# Patient Record
Sex: Female | Born: 1995 | Race: Black or African American | Hispanic: No | Marital: Single | Smoking: Former smoker
Health system: Southern US, Community
[De-identification: ages and names within clinical notes are randomized; demographics above are authoritative.]

## PROBLEM LIST (undated history)

## (undated) ENCOUNTER — Inpatient Hospital Stay (HOSPITAL_COMMUNITY): Payer: Self-pay

## (undated) ENCOUNTER — Ambulatory Visit (HOSPITAL_COMMUNITY): Admission: EM

## (undated) DIAGNOSIS — F32A Depression, unspecified: Secondary | ICD-10-CM

## (undated) DIAGNOSIS — F329 Major depressive disorder, single episode, unspecified: Secondary | ICD-10-CM

## (undated) DIAGNOSIS — O009 Unspecified ectopic pregnancy without intrauterine pregnancy: Secondary | ICD-10-CM

## (undated) HISTORY — PX: WISDOM TOOTH EXTRACTION: SHX21

## (undated) HISTORY — PX: TONSILLECTOMY: SUR1361

---

## 2018-03-30 ENCOUNTER — Ambulatory Visit (HOSPITAL_COMMUNITY)
Admission: EM | Admit: 2018-03-30 | Discharge: 2018-03-30 | Disposition: A | Discharge status: Home / Self Care | Payer: Self-pay | Attending: Family Medicine | Admitting: Family Medicine

## 2018-03-30 ENCOUNTER — Encounter (HOSPITAL_COMMUNITY): Payer: Self-pay | Admitting: Emergency Medicine

## 2018-03-30 DIAGNOSIS — N898 Other specified noninflammatory disorders of vagina: Secondary | ICD-10-CM | POA: Insufficient documentation

## 2018-03-30 DIAGNOSIS — F329 Major depressive disorder, single episode, unspecified: Secondary | ICD-10-CM | POA: Insufficient documentation

## 2018-03-30 DIAGNOSIS — Z880 Allergy status to penicillin: Secondary | ICD-10-CM | POA: Insufficient documentation

## 2018-03-30 DIAGNOSIS — N3090 Cystitis, unspecified without hematuria: Secondary | ICD-10-CM

## 2018-03-30 DIAGNOSIS — N390 Urinary tract infection, site not specified: Secondary | ICD-10-CM | POA: Insufficient documentation

## 2018-03-30 DIAGNOSIS — R809 Proteinuria, unspecified: Secondary | ICD-10-CM | POA: Insufficient documentation

## 2018-03-30 DIAGNOSIS — Z79899 Other long term (current) drug therapy: Secondary | ICD-10-CM | POA: Insufficient documentation

## 2018-03-30 HISTORY — DX: Major depressive disorder, single episode, unspecified: F32.9

## 2018-03-30 HISTORY — DX: Depression, unspecified: F32.A

## 2018-03-30 LAB — POCT URINALYSIS DIP (DEVICE)
BILIRUBIN URINE: NEGATIVE
Glucose, UA: NEGATIVE mg/dL
Ketones, ur: NEGATIVE mg/dL
Nitrite: NEGATIVE
PH: 8.5 — AB (ref 5.0–8.0)
Protein, ur: >=300 mg/dL — AB
Urobilinogen, UA: 0.2 mg/dL (ref 0.0–1.0)

## 2018-03-30 LAB — POCT PREGNANCY, URINE: Preg Test, Ur: NEGATIVE

## 2018-03-30 MED ORDER — SULFAMETHOXAZOLE-TRIMETHOPRIM 800-160 MG PO TABS: 1.0000 | ORAL_TABLET | Freq: Two times a day (BID) | ORAL | 0 refills | Status: AC

## 2018-03-30 NOTE — ED Provider Notes (Signed)
[facility]    CSN: [id number] Arrival date & time: 03/30/18  1458     History   Chief Complaint Chief Complaint  Patient presents with  . Dysuria    HPI Caitlin Grant is a 22 y.o. female.   22 year old female comes in for 2 week history of UTI symptoms.  She has had urinary frequency, urgency, hesitancy, dysuria.  States she has been trying to "get rid of the UTI" on her own, drinking cranberry juice and staying hydrated without relief.  She has intermittent low abdominal pressure, no obvious aggravating or alleviating factor.  Denies fever, chills, night sweats.  Mild nausea without vomiting. Mild vaginal discharge without vaginal irritation/pain.  LMP 03/16/2018.  Sexually active with one female partner, no condom use.  No other birth control use.     Past Medical History:  Diagnosis Date  . Depression     There are no active problems to display for this patient.   History reviewed. No pertinent surgical history.  OB History   None      Home Medications    Prior to Admission medications   Medication Sig Start Date End Date Taking? Authorizing Provider  atomoxetine (STRATTERA) 100 MG capsule Take 100 mg by mouth daily.   Yes [provider]  escitalopram (LEXAPRO) 20 MG tablet Take 20 mg by mouth daily.   Yes [provider]  mirtazapine (REMERON) 45 MG tablet Take 45 mg by mouth at bedtime.   Yes [provider]  sulfamethoxazole-trimethoprim (BACTRIM DS,SEPTRA DS) 800-160 MG tablet Take 1 tablet by mouth 2 (two) times daily for 3 days. 03/30/18 04/02/18  Belinda Fisher, PA-C    Family History No family history on file.  Social History Social History   Tobacco Use  . Smoking status: Never Smoker  Substance Use Topics  . Alcohol use: Yes  . Drug use: Never     Allergies   Penicillins   Review of Systems Review of Systems  Reason unable to perform ROS: See HPI as above.     Physical Exam Triage Vital Signs ED  Triage Vitals [03/30/18 1522]  Enc Vitals Group     BP 127/63     Pulse Rate (!) 102     Resp 18     Temp 97.7 F (36.5 C)     Temp Source Temporal     SpO2 100 %     Weight      Height      Head Circumference      Peak Flow      Pain Score      Pain Loc      Pain Edu?      Excl. in GC?    No data found.  Updated Vital Signs BP 127/63   Pulse (!) 102   Temp 97.7 F (36.5 C) (Temporal)   Resp 18   LMP 03/16/2018   SpO2 100%   Physical Exam  Constitutional: She is oriented to person, place, and time. She appears well-developed and well-nourished. No distress.  HENT:  Head: Normocephalic and atraumatic.  Eyes: Pupils are equal, round, and reactive to light. Conjunctivae are normal.  Cardiovascular: Normal rate, regular rhythm and normal heart sounds. Exam reveals no gallop and no friction rub.  No murmur heard. Pulmonary/Chest: Effort normal and breath sounds normal. She has no wheezes. She has no rales.  Abdominal: Soft. Bowel sounds are normal. She exhibits no mass. There is no rebound, no  guarding and no CVA tenderness.  Mild tenderness to low abdomen without guarding or rebound.   Neurological: She is alert and oriented to person, place, and time.  Skin: Skin is warm and dry.  Psychiatric: She has a normal mood and affect. Her behavior is normal. Judgment normal.    UC Treatments / Results  Labs (all labs ordered are listed, but only abnormal results are displayed) Labs Reviewed  POCT URINALYSIS DIP (DEVICE) - Abnormal; Notable for the following components:      Result Value   Hgb urine dipstick LARGE (*)    pH 8.5 (*)    Protein, ur >=300 (*)    Leukocytes, UA SMALL (*)    All other components within normal limits  URINE CULTURE  POCT PREGNANCY, URINE  URINE CYTOLOGY ANCILLARY ONLY    EKG None Radiology No results found.  Procedures Procedures (including critical care time)  Medications Ordered in UC Medications - No data to display   Initial  Impression / Assessment and Plan / UC Course  I have reviewed the triage vital signs and the nursing notes.  Pertinent labs & imaging results that were available during my care of the patient were reviewed by me and considered in my medical decision making (see chart for details).    Will treat for UTI with bactrim. Urine culture sent. Given blood and protein in urine, will have patient follow up in 2-3 weeks for recheck. Cytology sent, patient will be contacted with any positive results that require additional treatment. Patient to refrain from sexual activity for the next 7 days. Return precautions given. Otherwise follow up here or with PCP in 2-3 weeks for recheck of urine.   Case discussed with Dr Milus GlazierLauenstein, who agrees to plan.   Final Clinical Impressions(s) / UC Diagnoses   Final diagnoses:  Cystitis    ED Discharge Orders        Ordered    sulfamethoxazole-trimethoprim (BACTRIM DS,SEPTRA DS) 800-160 MG tablet  2 times daily     03/30/18 1548        Belinda FisherYu, Amy V, [place] 03/30/18 1557

## 2018-03-30 NOTE — ED Triage Notes (Signed)
Pt c/o lower pelvic pain and dysuria x2 weeks

## 2018-03-30 NOTE — Discharge Instructions (Addendum)
Urine with small amount of bacteria, given your symptoms, will treat you for urinary tract infection. Start bactrim as directed. Your urine also showed some blood and protein, this could be due to the infection, but will need to be monitored to make sure it resolves. Please follow up with PCP or here in 2-3 weeks for urine recheck. I have also sent the urine for culture to make sure infection is not resistant to the antibiotic we gave you. Cytology sent, you will be contacted with any positive results that requires further treatment. Refrain from sexual activity and alcohol use for the next 7 days. Monitor for any worsening of symptoms, fever, abdominal pain, nausea, vomiting, to follow up for reevaluation.

## 2018-04-01 ENCOUNTER — Telehealth (HOSPITAL_COMMUNITY): Payer: Self-pay

## 2018-04-01 LAB — URINE CULTURE: Culture: 10000 — AB

## 2018-04-01 LAB — URINE CYTOLOGY ANCILLARY ONLY: CANDIDA VAGINITIS: NEGATIVE

## 2018-04-01 NOTE — Telephone Encounter (Signed)
Urine culture was positive for E. Coli and was given Bactrim  at urgent care visit. No answer at this time. Voicemail left.

## 2018-04-02 LAB — URINE CYTOLOGY ANCILLARY ONLY
Chlamydia: NEGATIVE
Neisseria Gonorrhea: NEGATIVE
TRICH (WINDOWPATH): POSITIVE — AB

## 2018-04-05 ENCOUNTER — Telehealth (HOSPITAL_COMMUNITY): Payer: Self-pay

## 2018-04-05 MED ORDER — METRONIDAZOLE 500 MG PO TABS: 500.0000 mg | ORAL_TABLET | Freq: Two times a day (BID) | ORAL | 0 refills | Status: DC

## 2018-04-05 NOTE — Telephone Encounter (Signed)
Test results positive for Trichomonas and Bacterial Vaginosis. Pt requires prescription for Flagyl 500 mg bid x 7 days. No answer and voicemail not set up, will try again.

## 2018-04-07 ENCOUNTER — Telehealth (HOSPITAL_COMMUNITY): Payer: Self-pay

## 2018-04-07 NOTE — Telephone Encounter (Signed)
Pt returned call and aware of results  

## 2018-04-07 NOTE — Telephone Encounter (Signed)
Attempted to reach pt.x2

## 2018-04-13 ENCOUNTER — Ambulatory Visit: Payer: Self-pay | Admitting: Internal Medicine

## 2018-05-04 ENCOUNTER — Encounter (HOSPITAL_COMMUNITY): Payer: Self-pay | Admitting: Family Medicine

## 2018-05-04 ENCOUNTER — Ambulatory Visit (HOSPITAL_COMMUNITY)
Admission: EM | Admit: 2018-05-04 | Discharge: 2018-05-04 | Disposition: A | Discharge status: Home / Self Care | Payer: Self-pay | Attending: Family Medicine | Admitting: Family Medicine

## 2018-05-04 DIAGNOSIS — R300 Dysuria: Secondary | ICD-10-CM

## 2018-05-04 DIAGNOSIS — Z79899 Other long term (current) drug therapy: Secondary | ICD-10-CM | POA: Insufficient documentation

## 2018-05-04 DIAGNOSIS — N898 Other specified noninflammatory disorders of vagina: Secondary | ICD-10-CM

## 2018-05-04 DIAGNOSIS — Z202 Contact with and (suspected) exposure to infections with a predominantly sexual mode of transmission: Secondary | ICD-10-CM

## 2018-05-04 DIAGNOSIS — Z113 Encounter for screening for infections with a predominantly sexual mode of transmission: Secondary | ICD-10-CM

## 2018-05-04 DIAGNOSIS — Z3202 Encounter for pregnancy test, result negative: Secondary | ICD-10-CM

## 2018-05-04 DIAGNOSIS — F329 Major depressive disorder, single episode, unspecified: Secondary | ICD-10-CM | POA: Insufficient documentation

## 2018-05-04 DIAGNOSIS — Z880 Allergy status to penicillin: Secondary | ICD-10-CM | POA: Insufficient documentation

## 2018-05-04 LAB — POCT URINALYSIS DIP (DEVICE)
BILIRUBIN URINE: NEGATIVE
Glucose, UA: NEGATIVE mg/dL
Hgb urine dipstick: NEGATIVE
KETONES UR: NEGATIVE mg/dL
NITRITE: NEGATIVE
Protein, ur: NEGATIVE mg/dL
Specific Gravity, Urine: 1.015 (ref 1.005–1.030)
Urobilinogen, UA: 0.2 mg/dL (ref 0.0–1.0)
pH: 6.5 (ref 5.0–8.0)

## 2018-05-04 LAB — POCT PREGNANCY, URINE: PREG TEST UR: NEGATIVE

## 2018-05-04 MED ORDER — ESCITALOPRAM OXALATE 20 MG PO TABS: 20.0000 mg | ORAL_TABLET | Freq: Every day | ORAL | 0 refills | Status: DC

## 2018-05-04 MED ORDER — AZITHROMYCIN 250 MG PO TABS
1000.0000 mg | ORAL_TABLET | Freq: Once | ORAL | Status: AC
  Administered 2018-05-04: 1000 mg via ORAL

## 2018-05-04 MED ORDER — METRONIDAZOLE 500 MG PO TABS: 2000.0000 mg | ORAL_TABLET | Freq: Once | ORAL | 0 refills | Status: AC

## 2018-05-04 MED ORDER — AZITHROMYCIN 250 MG PO TABS
ORAL_TABLET | ORAL | Status: AC
  Filled 2018-05-04: qty 4

## 2018-05-04 MED ORDER — ATOMOXETINE HCL 100 MG PO CAPS: 100.0000 mg | ORAL_CAPSULE | Freq: Every day | ORAL | 0 refills | Status: DC

## 2018-05-04 MED ORDER — LIDOCAINE HCL (PF) 1 % IJ SOLN
INTRAMUSCULAR | Status: AC
  Filled 2018-05-04: qty 2

## 2018-05-04 MED ORDER — CEFTRIAXONE SODIUM 250 MG IJ SOLR
250.0000 mg | Freq: Once | INTRAMUSCULAR | Status: AC
Start: 2018-05-04 — End: 2018-05-04
  Administered 2018-05-04: 250 mg via INTRAMUSCULAR

## 2018-05-04 MED ORDER — MIRTAZAPINE 45 MG PO TABS: 45.0000 mg | ORAL_TABLET | Freq: Every day | ORAL | 0 refills | Status: DC

## 2018-05-04 MED ORDER — CEFTRIAXONE SODIUM 250 MG IJ SOLR
INTRAMUSCULAR | Status: AC
  Filled 2018-05-04: qty 250

## 2018-05-04 NOTE — Discharge Instructions (Signed)
We have treated you today for gonorrhea and chlamydia, with Rocephin and azithromycin.  I am also going to re-treat you for trichomonas, please take 4 tablets of metronidazole tomorrow.  Please refrain from sexual intercourse for 7 days while medicines eliminating infection.   We are testing you for Gonorrhea, Chlamydia, Trichomonas, Yeast and Bacterial Vaginosis. We will call you if anything is positive and let you know if you require any further treatment. Please inform partners of any positive results.   Please return if symptoms not improving with treatment, development of fever, nausea, vomiting, abdominal pain.

## 2018-05-04 NOTE — ED Provider Notes (Signed)
[facility]    CSN: [id number] Arrival date & time: 05/04/18  1724     History   Chief Complaint No chief complaint on file.   HPI Caitlin Grant is a 22 y.o. female presenting today for evaluation of STDs/vaginal discharge as well as medication refill.  Patient states that she recently found out her partner was exposed to gonorrhea.  States she has been having vaginal discharge for the past couple of weeks.  Recently tested positive for trichomonas, states she did not finish the course of antibiotics prescribed to her for this.  Also endorsing burning, sensitivity and itching.  Mild nausea, denies vomiting, fevers, abdominal pain.  HPI  Past Medical History:  Diagnosis Date  . Depression     There are no active problems to display for this patient.   History reviewed. No pertinent surgical history.  OB History   None      Home Medications    Prior to Admission medications   Medication Sig Start Date End Date Taking? Authorizing Provider  atomoxetine (STRATTERA) 100 MG capsule Take 1 capsule (100 mg total) by mouth daily. 05/04/18 06/03/18  Conan Mcmanaway C, PA-C  escitalopram (LEXAPRO) 20 MG tablet Take 1 tablet (20 mg total) by mouth daily. 05/04/18 06/03/18  Kay Shippy C, PA-C  metroNIDAZOLE (FLAGYL) 500 MG tablet Take 4 tablets (2,000 mg total) by mouth once for 1 dose. 05/04/18 05/04/18  Annisa Mazzarella C, PA-C  mirtazapine (REMERON) 45 MG tablet Take 1 tablet (45 mg total) by mouth at bedtime. 05/04/18 06/03/18  Gibril Mastro, Junius Creamer, PA-C    Family History History reviewed. No pertinent family history.  Social History Social History   Tobacco Use  . Smoking status: Never Smoker  Substance Use Topics  . Alcohol use: Yes  . Drug use: Never     Allergies   Penicillins   Review of Systems Review of Systems  Constitutional: Negative for fever.  Respiratory: Negative for shortness of breath.   Cardiovascular: Negative for chest pain.    Gastrointestinal: Positive for nausea. Negative for abdominal pain, diarrhea and vomiting.  Genitourinary: Positive for dysuria and vaginal discharge. Negative for flank pain, genital sores, hematuria, menstrual problem, vaginal bleeding and vaginal pain.  Musculoskeletal: Negative for back pain.  Skin: Negative for rash.  Neurological: Negative for dizziness, light-headedness and headaches.     Physical Exam Triage Vital Signs ED Triage Vitals  Enc Vitals Group     BP 05/04/18 1815 114/74     Pulse Rate 05/04/18 1815 78     Resp 05/04/18 1815 18     Temp 05/04/18 1815 98.1 F (36.7 C)     Temp src --      SpO2 05/04/18 1815 100 %     Weight --      Height --      Head Circumference --      Peak Flow --      Pain Score 05/04/18 1814 0     Pain Loc --      Pain Edu? --      Excl. in GC? --    No data found.  Updated Vital Signs BP 114/74   Pulse 78   Temp 98.1 F (36.7 C)   Resp 18   LMP 03/29/2018   SpO2 100%   Visual Acuity Right Eye Distance:   Left Eye Distance:   Bilateral Distance:    Right Eye Near:   Left Eye Near:    Bilateral Near:  Physical Exam  Constitutional: She appears well-developed and well-nourished. No distress.  HENT:  Head: Normocephalic and atraumatic.  Eyes: Conjunctivae are normal.  Neck: Neck supple.  Cardiovascular: Normal rate and regular rhythm.  No murmur heard. Pulmonary/Chest: Effort normal and breath sounds normal. No respiratory distress.  Abdominal: Soft. There is no tenderness.  Genitourinary:  Genitourinary Comments: Normal female external genitalia, significant amount of whitish-green discharge seen in the vulva area, as well as inside vaginal vault, cervix erythematous, no cervical motion tenderness  Musculoskeletal: She exhibits no edema.  Neurological: She is alert.  Skin: Skin is warm and dry.  Psychiatric: She has a normal mood and affect.  Nursing note and vitals reviewed.    UC Treatments / Results   Labs (all labs ordered are listed, but only abnormal results are displayed) Labs Reviewed  CERVICOVAGINAL ANCILLARY ONLY    EKG None  Radiology No results found.  Procedures Procedures (including critical care time)  Medications Ordered in UC Medications  cefTRIAXone (ROCEPHIN) injection 250 mg (has no administration in time range)  azithromycin (ZITHROMAX) tablet 1,000 mg (has no administration in time range)    Initial Impression / Assessment and Plan / UC Course  I have reviewed the triage vital signs and the nursing notes.  Pertinent labs & imaging results that were available during my care of the patient were reviewed by me and considered in my medical decision making (see chart for details).     We will go ahead and empirically treat for gonorrhea and chlamydia, will retreat for trichomonas given failure to complete full course previously.  Will do single dose regimen this time to ensure compliance.  Vaginal swab obtained, will call and alter treatment as needed.Discussed strict return precautions. Patient verbalized understanding and is agreeable with plan.  Final Clinical Impressions(s) / UC Diagnoses   Final diagnoses:  STD exposure  Vaginal discharge     Discharge Instructions     We have treated you today for gonorrhea and chlamydia, with Rocephin and azithromycin.  I am also going to re-treat you for trichomonas, please take 4 tablets of metronidazole tomorrow.  Please refrain from sexual intercourse for 7 days while medicines eliminating infection.   We are testing you for Gonorrhea, Chlamydia, Trichomonas, Yeast and Bacterial Vaginosis. We will call you if anything is positive and let you know if you require any further treatment. Please inform partners of any positive results.   Please return if symptoms not improving with treatment, development of fever, nausea, vomiting, abdominal pain.    ED Prescriptions    Medication Sig Dispense Auth. Provider    metroNIDAZOLE (FLAGYL) 500 MG tablet Take 4 tablets (2,000 mg total) by mouth once for 1 dose. 4 tablet Juletta Berhe C, PA-C   atomoxetine (STRATTERA) 100 MG capsule Take 1 capsule (100 mg total) by mouth daily. 30 capsule Tyan Lasure C, PA-C   escitalopram (LEXAPRO) 20 MG tablet Take 1 tablet (20 mg total) by mouth daily. 30 tablet Demetress Tift C, PA-C   mirtazapine (REMERON) 45 MG tablet Take 1 tablet (45 mg total) by mouth at bedtime. 30 tablet Garrett Bowring, [place] C, PA-C     Controlled Substance Prescriptions [place] Controlled Substance Registry consulted? Not Applicable   Lew Dawes, [place] 05/04/18 1844

## 2018-05-04 NOTE — ED Triage Notes (Signed)
Pt reports that she has been exposed to gonorrhea. sts also that she needs meds refilled. She needs lexapro, Remeron and straterra

## 2018-05-05 LAB — CERVICOVAGINAL ANCILLARY ONLY
Bacterial vaginitis: NEGATIVE
CHLAMYDIA, DNA PROBE: NEGATIVE
Candida vaginitis: POSITIVE — AB
Neisseria Gonorrhea: POSITIVE — AB
TRICH (WINDOWPATH): NEGATIVE

## 2018-05-07 ENCOUNTER — Telehealth (HOSPITAL_COMMUNITY): Payer: Self-pay

## 2018-05-07 MED ORDER — FLUCONAZOLE 150 MG PO TABS: 150.0000 mg | ORAL_TABLET | Freq: Every day | ORAL | 0 refills | Status: AC

## 2018-05-07 NOTE — Telephone Encounter (Signed)
Test for gonorrhea was positive. This was treated at the urgent care visit with IM rocephin  and po zithromax 1g. Need to educate patient to refrain from sexual intercourse for 7 days after treatment to give the medicine time to work. Sexual partners need to be notified and tested/treated. Condoms may reduce risk of reinfection. Recheck or followup with PCP for further evaluation if symptoms are not improving. [facility] notified.   Pt contacted regarding test for candida (yeast) was positive.  Prescription for fluconazole  po now, repeat dose in 3d if needed, #2 no refills, sent to the pharmacy of record.  Recheck or followup with PCP for further evaluation if symptoms are not improving.

## 2018-05-08 ENCOUNTER — Telehealth (HOSPITAL_COMMUNITY): Payer: Self-pay

## 2018-05-08 NOTE — Telephone Encounter (Signed)
Attempted to reach patient x2. No answer at this time. Voicemail left.    

## 2018-05-10 ENCOUNTER — Telehealth (HOSPITAL_COMMUNITY): Payer: Self-pay

## 2018-05-10 NOTE — Telephone Encounter (Signed)
Pt aware of results. Educated on safe sex practices. 

## 2018-07-20 ENCOUNTER — Other Ambulatory Visit: Payer: Self-pay

## 2018-07-20 ENCOUNTER — Encounter (HOSPITAL_COMMUNITY): Payer: Self-pay

## 2018-07-20 ENCOUNTER — Ambulatory Visit (HOSPITAL_COMMUNITY)
Admission: EM | Admit: 2018-07-20 | Discharge: 2018-07-20 | Disposition: A | Discharge status: Home / Self Care | Payer: Self-pay | Attending: Family Medicine | Admitting: Family Medicine

## 2018-07-20 DIAGNOSIS — Z79899 Other long term (current) drug therapy: Secondary | ICD-10-CM | POA: Insufficient documentation

## 2018-07-20 DIAGNOSIS — R350 Frequency of micturition: Secondary | ICD-10-CM | POA: Insufficient documentation

## 2018-07-20 DIAGNOSIS — F329 Major depressive disorder, single episode, unspecified: Secondary | ICD-10-CM | POA: Insufficient documentation

## 2018-07-20 DIAGNOSIS — N898 Other specified noninflammatory disorders of vagina: Secondary | ICD-10-CM | POA: Insufficient documentation

## 2018-07-20 LAB — POCT URINALYSIS DIP (DEVICE)
BILIRUBIN URINE: NEGATIVE
Glucose, UA: NEGATIVE mg/dL
HGB URINE DIPSTICK: NEGATIVE
Ketones, ur: NEGATIVE mg/dL
Leukocytes, UA: NEGATIVE
NITRITE: NEGATIVE
PH: 6.5 (ref 5.0–8.0)
Protein, ur: NEGATIVE mg/dL
SPECIFIC GRAVITY, URINE: 1.025 (ref 1.005–1.030)
Urobilinogen, UA: 0.2 mg/dL (ref 0.0–1.0)

## 2018-07-20 MED ORDER — METRONIDAZOLE 500 MG PO TABS: 500.0000 mg | ORAL_TABLET | Freq: Two times a day (BID) | ORAL | 0 refills | Status: AC

## 2018-07-20 NOTE — ED Triage Notes (Signed)
Pt thinks she has a UTI

## 2018-07-20 NOTE — Discharge Instructions (Signed)
Your urine did not show any signs of UTI.  I am going to go ahead and treat you for bacterial vaginosis with metronidazole, please take twice daily for the next week, please do not drink alcohol until 24 hours after taking the last tablet.  We are testing you for Gonorrhea, Chlamydia, Trichomonas, Yeast and Bacterial Vaginosis. We will call you if anything is positive and let you know if you require any further treatment. Please inform partners of any positive results.   Please return if symptoms not improving with treatment, development of fever, nausea, vomiting, abdominal pain.

## 2018-07-20 NOTE — ED Provider Notes (Signed)
[facility]    CSN: [id number] Arrival date & time: 07/20/18  1353     History   Chief Complaint Chief Complaint  Patient presents with  . Urinary Tract Infection    HPI Caitlin Grant is a 22 y.o. female notes no past medical today for evaluation of possible UTI and vaginal discharge.  Patient states that for the past 2 weeks she has had pressure with urination as well as increased frequency.  She denies any dysuria.  She has been trying to eat yogurt and drink cranberry juice without relief.  Shortly after she developed a white thin discharge with an associated odor.  She denies any itching or irritation.  Has tested positive for yeast and BV in the past.  He also recently tested positive for gonorrhea, she states that she did not before having sexual intercourse again.  Last menstrual period was 2 weeks ago, denies using any form of birth control.  HPI  Past Medical History:  Diagnosis Date  . Depression     There are no active problems to display for this patient.   History reviewed. No pertinent surgical history.  OB History   None      Home Medications    Prior to Admission medications   Medication Sig Start Date End Date Taking? Authorizing Provider  ARIPiprazole (ABILIFY) 5 MG tablet Take 5 mg by mouth daily.   Yes [provider]  escitalopram (LEXAPRO) 20 MG tablet Take 1 tablet (20 mg total) by mouth daily. Patient taking differently: Take 10 mg by mouth daily.  05/04/18 06/03/18  Wieters, Hallie C, PA-C  metroNIDAZOLE (FLAGYL) 500 MG tablet Take 1 tablet (500 mg total) by mouth 2 (two) times daily for 7 days. 07/20/18 07/27/18  Wieters, Junius CreamerHallie C, PA-C    Family History History reviewed. No pertinent family history.  Social History Social History   Tobacco Use  . Smoking status: Never Smoker  . Smokeless tobacco: Never Used  Substance Use Topics  . Alcohol use: Yes    Comment: occ  . Drug use: Never     Allergies    Penicillins   Review of Systems Review of Systems  Constitutional: Negative for fever.  Respiratory: Negative for shortness of breath.   Cardiovascular: Negative for chest pain.  Gastrointestinal: Negative for abdominal pain, diarrhea, nausea and vomiting.  Genitourinary: Positive for frequency and vaginal discharge. Negative for dysuria, flank pain, genital sores, hematuria, menstrual problem, vaginal bleeding and vaginal pain.  Musculoskeletal: Negative for back pain.  Skin: Negative for rash.  Neurological: Negative for dizziness, light-headedness and headaches.     Physical Exam Triage Vital Signs ED Triage Vitals  Enc Vitals Group     BP 07/20/18 1432 99/60     Pulse Rate 07/20/18 1432 72     Resp 07/20/18 1432 16     Temp 07/20/18 1432 97.8 F (36.6 C)     Temp src --      SpO2 07/20/18 1432 100 %     Weight 07/20/18 1434 148 lb (67.1 kg)     Height --      Head Circumference --      Peak Flow --      Pain Score 07/20/18 1432 7     Pain Loc --      Pain Edu? --      Excl. in GC? --    No data found.  Updated Vital Signs BP 99/60   Pulse 72   Temp  97.8 F (36.6 C)   Resp 16   Wt 148 lb (67.1 kg)   LMP 07/05/2018   SpO2 100%   Visual Acuity Right Eye Distance:   Left Eye Distance:   Bilateral Distance:    Right Eye Near:   Left Eye Near:    Bilateral Near:     Physical Exam  Constitutional: She is oriented to person, place, and time. She appears well-developed and well-nourished.  No acute distress  HENT:  Head: Normocephalic and atraumatic.  Nose: Nose normal.  Eyes: Conjunctivae are normal.  Neck: Neck supple.  Cardiovascular: Normal rate.  Pulmonary/Chest: Effort normal. No respiratory distress.  Abdominal: She exhibits no distension.  Musculoskeletal: Normal range of motion.  Neurological: She is alert and oriented to person, place, and time.  Skin: Skin is warm and dry.  Psychiatric: She has a normal mood and affect.  Nursing note and  vitals reviewed.    UC Treatments / Results  Labs (all labs ordered are listed, but only abnormal results are displayed) Labs Reviewed  POCT URINALYSIS DIP (DEVICE)  CERVICOVAGINAL ANCILLARY ONLY    EKG None  Radiology No results found.  Procedures Procedures (including critical care time)  Medications Ordered in UC Medications - No data to display  Initial Impression / Assessment and Plan / UC Course  I have reviewed the triage vital signs and the nursing notes.  Pertinent labs & imaging results that were available during my care of the patient were reviewed by me and considered in my medical decision making (see chart for details).    UA negative, pregnancy test negative, we will go to empirically treat for BV.  Discussed concern about not waiting full week after treatment for gonorrhea.  Patient wishes to hold off on treatment due to cost.  Vaginal swab obtained and will send off, will call patient with any positive results and alter treatment as needed.Discussed strict return precautions. Patient verbalized understanding and is agreeable with plan.  Final Clinical Impressions(s) / UC Diagnoses   Final diagnoses:  Urinary frequency  Vaginal discharge     Discharge Instructions     Your urine did not show any signs of UTI.  I am going to go ahead and treat you for bacterial vaginosis with metronidazole, please take twice daily for the next week, please do not drink alcohol until 24 hours after taking the last tablet.  We are testing you for Gonorrhea, Chlamydia, Trichomonas, Yeast and Bacterial Vaginosis. We will call you if anything is positive and let you know if you require any further treatment. Please inform partners of any positive results.   Please return if symptoms not improving with treatment, development of fever, nausea, vomiting, abdominal pain.    ED Prescriptions    Medication Sig Dispense Auth. Provider   metroNIDAZOLE (FLAGYL) 500 MG tablet Take 1  tablet (500 mg total) by mouth 2 (two) times daily for 7 days. 14 tablet Wieters, [place] C, PA-C     Controlled Substance Prescriptions [place] Controlled Substance Registry consulted? Not Applicable   Lew Dawes, [place] 07/20/18 1512

## 2018-07-20 NOTE — ED Triage Notes (Signed)
Pt also has a vaginal discharge.

## 2018-07-21 LAB — CERVICOVAGINAL ANCILLARY ONLY
BACTERIAL VAGINITIS: POSITIVE — AB
Candida vaginitis: NEGATIVE
Chlamydia: NEGATIVE
NEISSERIA GONORRHEA: NEGATIVE
Trichomonas: NEGATIVE

## 2018-08-26 ENCOUNTER — Other Ambulatory Visit: Payer: Self-pay

## 2018-08-26 ENCOUNTER — Encounter (HOSPITAL_COMMUNITY): Payer: Self-pay

## 2018-08-26 ENCOUNTER — Emergency Department (HOSPITAL_COMMUNITY)
Admission: EM | Admit: 2018-08-26 | Discharge: 2018-08-26 | Disposition: A | Discharge status: Home / Self Care | Payer: Self-pay | Attending: Emergency Medicine | Admitting: Emergency Medicine

## 2018-08-26 DIAGNOSIS — N939 Abnormal uterine and vaginal bleeding, unspecified: Secondary | ICD-10-CM | POA: Insufficient documentation

## 2018-08-26 DIAGNOSIS — N946 Dysmenorrhea, unspecified: Secondary | ICD-10-CM | POA: Insufficient documentation

## 2018-08-26 HISTORY — DX: Unspecified ectopic pregnancy without intrauterine pregnancy: O00.90

## 2018-08-26 LAB — COMPREHENSIVE METABOLIC PANEL
ALT: 13 U/L (ref 0–44)
ANION GAP: 9 (ref 5–15)
AST: 22 U/L (ref 15–41)
Albumin: 3.9 g/dL (ref 3.5–5.0)
Alkaline Phosphatase: 44 U/L (ref 38–126)
BUN: 9 mg/dL (ref 6–20)
CHLORIDE: 106 mmol/L (ref 98–111)
CO2: 25 mmol/L (ref 22–32)
Calcium: 8.9 mg/dL (ref 8.9–10.3)
Creatinine, Ser: 0.83 mg/dL (ref 0.44–1.00)
GFR calc Af Amer: >60 mL/min (ref >60)
Glucose, Bld: 121 mg/dL — ABNORMAL HIGH (ref 70–99)
POTASSIUM: 3.8 mmol/L (ref 3.5–5.1)
Sodium: 140 mmol/L (ref 135–145)
TOTAL PROTEIN: 6.5 g/dL (ref 6.5–8.1)
Total Bilirubin: 0.8 mg/dL (ref 0.3–1.2)

## 2018-08-26 LAB — CBC WITH DIFFERENTIAL/PLATELET
ABS IMMATURE GRANULOCYTES: 0 10*3/uL (ref 0.0–0.1)
BASOS ABS: 0 10*3/uL (ref 0.0–0.1)
Basophils Relative: 1 %
Eosinophils Absolute: 0.1 10*3/uL (ref 0.0–0.7)
Eosinophils Relative: 2 %
HEMATOCRIT: 33.3 % — AB (ref 36.0–46.0)
Hemoglobin: 11.8 g/dL — ABNORMAL LOW (ref 12.0–15.0)
IMMATURE GRANULOCYTES: 0 %
LYMPHS PCT: 36 %
Lymphs Abs: 2.5 10*3/uL (ref 0.7–4.0)
MCH: 28.6 pg (ref 26.0–34.0)
MCHC: 35.4 g/dL (ref 30.0–36.0)
MCV: 80.6 fL (ref 78.0–100.0)
Monocytes Absolute: 0.4 10*3/uL (ref 0.1–1.0)
Monocytes Relative: 6 %
NEUTROS ABS: 3.7 10*3/uL (ref 1.7–7.7)
NEUTROS PCT: 55 %
Platelets: 284 10*3/uL (ref 150–400)
RBC: 4.13 MIL/uL (ref 3.87–5.11)
RDW: 13.1 % (ref 11.5–15.5)
WBC: 6.7 10*3/uL (ref 4.0–10.5)

## 2018-08-26 LAB — I-STAT BETA HCG BLOOD, ED (MC, WL, AP ONLY): I-stat hCG, quantitative: <5 m[IU]/mL (ref <5)

## 2018-08-26 LAB — URINALYSIS, ROUTINE W REFLEX MICROSCOPIC
BILIRUBIN URINE: NEGATIVE
Bacteria, UA: NONE SEEN
Glucose, UA: NEGATIVE mg/dL
KETONES UR: NEGATIVE mg/dL
LEUKOCYTES UA: NEGATIVE
Nitrite: NEGATIVE
PROTEIN: NEGATIVE mg/dL
Specific Gravity, Urine: 1.029 (ref 1.005–1.030)
pH: 5 (ref 5.0–8.0)

## 2018-08-26 LAB — WET PREP, GENITAL
Clue Cells Wet Prep HPF POC: NONE SEEN
Sperm: NONE SEEN
Trich, Wet Prep: NONE SEEN
Yeast Wet Prep HPF POC: NONE SEEN

## 2018-08-26 NOTE — Discharge Instructions (Addendum)
Please read attached information. If you experience any new or worsening signs or symptoms please return to the emergency room for evaluation. Please follow-up with your primary care provider or specialist as discussed.  °

## 2018-08-26 NOTE — ED Triage Notes (Signed)
Pt endorses right sided pelvic pain and vaginal bleeding that began last night. Has taken multiple recent pregnancy tests that were negative. Last normal period was 2 weeks ago. VSS

## 2018-08-26 NOTE — ED Provider Notes (Signed)
MSE was initiated and I personally evaluated the patient and placed orders (if any) at  1:57 PM on August 26, 2018.  The patient appears stable at this time so that the remainder of the MSE may be completed by another provider.   Patient placed in Quick Look pathway, seen and evaluated   Chief Complaint: vaginal bleeding  HPI:   22 yo female, hx of ectopic pregnancy, presents with complaint of vaginal bleeding and R groin/pelvic pain since last night. Sxs have been constant. No specific alleviating/aggravating factors. She reports dark colored blood, she is unsure of how heavy bleeding has been. She reports LMP 2 weeks ago, pain she is having is similar to pain she has had with menstrual cycle in the past, but more severe. She thinks this may be similar to her ectopic pregnancy she had several years ago, but she cannot be sure. She took several pregnancy tests at home, all negative. Reports sexually active with 2 partners, using condoms for protection, but admits to unprotected sex. + vaginal discharge and nausea.   ROS: + vaginal bleeding, vaginal discharge, nausea, pelvic/groin pain. - vomiting, diarrhea, dysuria  Physical Exam:   Gen: No distress  Neuro: Awake and Alert  Skin: Warm    Focused Exam: Tender to R pelvic/groin area, no rebound/rigidity/guarding.    Initiation of care has begun. The patient has been counseled on the process, plan, and necessity for staying for the completion/evaluation, and the remainder of the medical screening examination. I discussed with patient  and if present family/friends the importance of alerting staff to any new or worsening symptoms or any other concerns throughout his/her ER visit.     [name], Pandora Mccrackin R, PA-C 08/26/18 1412    Arby BarrettePfeiffer, Marcy, MD 08/31/18 [phone]

## 2018-08-26 NOTE — ED Notes (Signed)
Reviewed d/c instructions with pt, who verbalized understanding and had no outstanding questions. Pt departed in NAD, refused use of wheelchair.   

## 2018-08-26 NOTE — ED Provider Notes (Addendum)
[facility] EMERGENCY DEPARTMENT Provider Note   CSN: [id number] Arrival date & time: 08/26/18  1347   History   Chief Complaint Chief Complaint  Patient presents with  . Pelvic Pain  . Vaginal Bleeding    HPI Caitlin Grant is a 22 y.o. female.  HPI   22 year old female presents today with complaints of pelvic pain and vaginal bleeding.  Patient notes approximately 2 weeks ago she had a menstrual cycle.  She notes over the last several months she has had irregular menstruation.  She notes again last night she started having bleeding typical of previous menstrual cycle with right-sided abdominal cramping pain.  She notes that this is similar to previous menstrual cycles, with similar characteristics and location although slightly more severe.  Patient denies any upper abdominal pain, denies fever chills.  She notes she is having some vaginal discharge.  Patient notes a significant past medical history of ectopic pregnancies but has noted several home pregnancy test showing negative.     Past Medical History:  Diagnosis Date  . Depression   . Ectopic pregnancy     There are no active problems to display for this patient.   History reviewed. No pertinent surgical history.   OB History   None      Home Medications    Prior to Admission medications   Medication Sig Start Date End Date Taking? Authorizing Provider  ARIPiprazole (ABILIFY) 5 MG tablet Take 5 mg by mouth daily.    [provider]  escitalopram (LEXAPRO) 20 MG tablet Take 1 tablet (20 mg total) by mouth daily. Patient taking differently: Take 10 mg by mouth daily.  05/04/18 06/03/18  Wieters, Junius Creamer, PA-C    Family History History reviewed. No pertinent family history.  Social History Social History   Tobacco Use  . Smoking status: Current Every Day Smoker    Packs/day: 0.50    Types: Cigarettes  . Smokeless tobacco: Never Used  Substance Use Topics  . Alcohol use: Yes     Comment: occ  . Drug use: Never     Allergies   Penicillins   Review of Systems Review of Systems  All other systems reviewed and are negative.   Physical Exam Updated Vital Signs BP 97/62   Pulse 70   Temp 98.2 F (36.8 C) (Oral)   Resp 17   LMP 08/12/2018 (Exact Date)   SpO2 100%   Physical Exam  Constitutional: She is oriented to person, place, and time. She appears well-developed and well-nourished.  HENT:  Head: Normocephalic and atraumatic.  Eyes: Pupils are equal, round, and reactive to light. Conjunctivae are normal. Right eye exhibits no discharge. Left eye exhibits no discharge. No scleral icterus.  Neck: Normal range of motion. No JVD present. No tracheal deviation present.  Pulmonary/Chest: Effort normal. No stridor.  Abdominal: Soft. She exhibits no distension and no mass. There is no tenderness. There is no rebound and no guarding. No hernia.  Genitourinary:  Genitourinary Comments: Vaginal vault with scant blood, no significant discharge noted - no cervical motion tenderness, adnexal tenderness or masses   Neurological: She is alert and oriented to person, place, and time. Coordination normal.  Psychiatric: She has a normal mood and affect. Her behavior is normal. Judgment and thought content normal.  Nursing note and vitals reviewed.    ED Treatments / Results  Labs (all labs ordered are listed, but only abnormal results are displayed) Labs Reviewed  WET PREP, GENITAL - Abnormal;  Notable for the following components:      Result Value   WBC, Wet Prep HPF POC MANY (*)    All other components within normal limits  URINALYSIS, ROUTINE W REFLEX MICROSCOPIC - Abnormal; Notable for the following components:   APPearance HAZY (*)    Hgb urine dipstick LARGE (*)    All other components within normal limits  COMPREHENSIVE METABOLIC PANEL - Abnormal; Notable for the following components:   Glucose, Bld 121 (*)    All other components within normal  limits  CBC WITH DIFFERENTIAL/PLATELET - Abnormal; Notable for the following components:   Hemoglobin 11.8 (*)    HCT 33.3 (*)    All other components within normal limits  HIV ANTIBODY (ROUTINE TESTING)  RPR  I-STAT BETA HCG BLOOD, ED ([facility], [facility], [facility] ONLY)  GC/CHLAMYDIA PROBE AMP () NOT AT [facility]    EKG None  Radiology No results found.  Procedures Procedures (including critical care time)  Medications Ordered in ED Medications - No data to display   Initial Impression / Assessment and Plan / ED Course  I have reviewed the triage vital signs and the nursing notes.  Pertinent labs & imaging results that were available during my care of the patient were reviewed by me and considered in my medical decision making (see chart for details).     Labs: Wet prep, i-STAT beta-hCG, CMP, CBC, HIV, RPR, UA  Imaging:  Consults:  Therapeutics:  Discharge Meds:   Assessment/Plan: 22 year old female presents today with pelvic pain.  This is likely dysmenorrhea, she is well-appearing in no acute distress, she has no signs of ovarian torsion, PID, or significant intrapelvic pathology.  Patient will be referred to OB/GYN, encouraged to use anti-inflammatories as needed for discomfort return immediately with any new or worsening signs or symptoms.  She verbalized understanding and agreement to today's plan had no further questions or concerns at the time of discharge.    Final Clinical Impressions(s) / ED Diagnoses   Final diagnoses:  Vaginal bleeding    ED Discharge Orders    None         Rosalio LoudHedges, Ailie Gage, PA-C 08/26/18 1931    Shaune PollackIsaacs, Cameron, MD 08/29/18 [phone]

## 2018-08-27 LAB — GC/CHLAMYDIA PROBE AMP (~~LOC~~) NOT AT ARMC
Chlamydia: NEGATIVE
Neisseria Gonorrhea: NEGATIVE

## 2018-08-27 LAB — HIV ANTIBODY (ROUTINE TESTING W REFLEX): HIV SCREEN 4TH GENERATION: NONREACTIVE

## 2018-08-27 LAB — RPR: RPR Ser Ql: NONREACTIVE

## 2018-10-12 ENCOUNTER — Encounter (HOSPITAL_COMMUNITY): Payer: Self-pay | Admitting: Emergency Medicine

## 2018-10-12 ENCOUNTER — Ambulatory Visit (HOSPITAL_COMMUNITY)
Admission: EM | Admit: 2018-10-12 | Discharge: 2018-10-12 | Disposition: A | Discharge status: Home / Self Care | Payer: Self-pay | Attending: Family Medicine | Admitting: Family Medicine

## 2018-10-12 DIAGNOSIS — Z880 Allergy status to penicillin: Secondary | ICD-10-CM | POA: Insufficient documentation

## 2018-10-12 DIAGNOSIS — F329 Major depressive disorder, single episode, unspecified: Secondary | ICD-10-CM | POA: Insufficient documentation

## 2018-10-12 DIAGNOSIS — N3000 Acute cystitis without hematuria: Secondary | ICD-10-CM

## 2018-10-12 DIAGNOSIS — Z79899 Other long term (current) drug therapy: Secondary | ICD-10-CM | POA: Insufficient documentation

## 2018-10-12 DIAGNOSIS — F17210 Nicotine dependence, cigarettes, uncomplicated: Secondary | ICD-10-CM | POA: Insufficient documentation

## 2018-10-12 DIAGNOSIS — N3090 Cystitis, unspecified without hematuria: Secondary | ICD-10-CM | POA: Insufficient documentation

## 2018-10-12 LAB — POCT URINALYSIS DIP (DEVICE)
Bilirubin Urine: NEGATIVE
Glucose, UA: NEGATIVE mg/dL
Ketones, ur: NEGATIVE mg/dL
Nitrite: NEGATIVE
Protein, ur: NEGATIVE mg/dL
SPECIFIC GRAVITY, URINE: 1.015 (ref 1.005–1.030)
UROBILINOGEN UA: 0.2 mg/dL (ref 0.0–1.0)
pH: 7.5 (ref 5.0–8.0)

## 2018-10-12 MED ORDER — NITROFURANTOIN MONOHYD MACRO 100 MG PO CAPS: 100.0000 mg | ORAL_CAPSULE | Freq: Two times a day (BID) | ORAL | 0 refills | Status: DC

## 2018-10-12 NOTE — Discharge Instructions (Signed)
Your urine was positive for an urinary tract infection. Start macrobid as directed. Keep hydrated, your urine should be clear to pale yellow in color. Monitor for any worsening of symptoms, fever, worsening abdominal pain, nausea/vomiting, flank pain, follow up for reevaluation.  ° °
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
## 2018-10-14 LAB — URINE CULTURE

## 2018-10-15 ENCOUNTER — Telehealth (HOSPITAL_COMMUNITY): Payer: Self-pay

## 2018-10-15 MED ORDER — SULFAMETHOXAZOLE-TRIMETHOPRIM 800-160 MG PO TABS: 1.0000 | ORAL_TABLET | Freq: Two times a day (BID) | ORAL | 0 refills | Status: AC

## 2018-10-15 NOTE — Telephone Encounter (Signed)
Urine culture positive for Enterobacter Aerogenes. Intermediate to Macrobid. Rx for Bactrim BID x 5 days sent to pharmacy of choice. Pt called and made aware.

## 2019-03-04 ENCOUNTER — Other Ambulatory Visit: Payer: Self-pay

## 2019-03-04 ENCOUNTER — Emergency Department (HOSPITAL_BASED_OUTPATIENT_CLINIC_OR_DEPARTMENT_OTHER)
Admission: EM | Admit: 2019-03-04 | Discharge: 2019-03-04 | Disposition: A | Discharge status: Home / Self Care | Payer: Self-pay | Attending: Emergency Medicine | Admitting: Emergency Medicine

## 2019-03-04 ENCOUNTER — Emergency Department (HOSPITAL_BASED_OUTPATIENT_CLINIC_OR_DEPARTMENT_OTHER): Payer: Self-pay

## 2019-03-04 ENCOUNTER — Encounter (HOSPITAL_BASED_OUTPATIENT_CLINIC_OR_DEPARTMENT_OTHER): Payer: Self-pay | Admitting: *Deleted

## 2019-03-04 DIAGNOSIS — S0101XA Laceration without foreign body of scalp, initial encounter: Secondary | ICD-10-CM | POA: Insufficient documentation

## 2019-03-04 DIAGNOSIS — S0121XA Laceration without foreign body of nose, initial encounter: Secondary | ICD-10-CM | POA: Insufficient documentation

## 2019-03-04 DIAGNOSIS — Y998 Other external cause status: Secondary | ICD-10-CM | POA: Insufficient documentation

## 2019-03-04 DIAGNOSIS — F329 Major depressive disorder, single episode, unspecified: Secondary | ICD-10-CM | POA: Insufficient documentation

## 2019-03-04 DIAGNOSIS — Y929 Unspecified place or not applicable: Secondary | ICD-10-CM | POA: Insufficient documentation

## 2019-03-04 DIAGNOSIS — Y9389 Activity, other specified: Secondary | ICD-10-CM | POA: Insufficient documentation

## 2019-03-04 DIAGNOSIS — S0181XA Laceration without foreign body of other part of head, initial encounter: Secondary | ICD-10-CM

## 2019-03-04 DIAGNOSIS — F17210 Nicotine dependence, cigarettes, uncomplicated: Secondary | ICD-10-CM | POA: Insufficient documentation

## 2019-03-04 MED ORDER — LIDOCAINE HCL 2 % IJ SOLN
10.0000 mL | Freq: Once | INTRAMUSCULAR | Status: AC
  Administered 2019-03-04: 200 mg
  Filled 2019-03-04: qty 20

## 2019-03-04 MED ORDER — CLINDAMYCIN HCL 150 MG PO CAPS: 300.0000 mg | ORAL_CAPSULE | Freq: Three times a day (TID) | ORAL | Status: DC

## 2019-03-04 MED ORDER — TETANUS-DIPHTH-ACELL PERTUSSIS 5-2.5-18.5 LF-MCG/0.5 IM SUSP
0.5000 mL | Freq: Once | INTRAMUSCULAR | Status: DC
  Filled 2019-03-04: qty 0.5
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
## 2019-03-04 NOTE — Discharge Instructions (Addendum)
Follow-up with Dr Jearld Fenton as he recommended.
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
## 2019-03-04 NOTE — ED Notes (Signed)
ED Provider at bedside. 

## 2019-03-04 NOTE — ED Notes (Signed)
Dr Jearld Fenton was at bedside

## 2019-03-17 ENCOUNTER — Other Ambulatory Visit: Payer: Self-pay

## 2019-03-17 ENCOUNTER — Emergency Department (HOSPITAL_COMMUNITY)
Admission: EM | Admit: 2019-03-17 | Discharge: 2019-03-17 | Disposition: A | Discharge status: Home / Self Care | Payer: Self-pay | Attending: Emergency Medicine | Admitting: Emergency Medicine

## 2019-03-17 ENCOUNTER — Encounter (HOSPITAL_COMMUNITY): Payer: Self-pay | Admitting: Emergency Medicine

## 2019-03-17 DIAGNOSIS — S0121XD Laceration without foreign body of nose, subsequent encounter: Secondary | ICD-10-CM | POA: Insufficient documentation

## 2019-03-17 DIAGNOSIS — Z4802 Encounter for removal of sutures: Secondary | ICD-10-CM | POA: Insufficient documentation

## 2019-03-17 DIAGNOSIS — Z880 Allergy status to penicillin: Secondary | ICD-10-CM | POA: Insufficient documentation

## 2019-03-17 DIAGNOSIS — F17210 Nicotine dependence, cigarettes, uncomplicated: Secondary | ICD-10-CM | POA: Insufficient documentation

## 2019-03-17 DIAGNOSIS — Z91040 Latex allergy status: Secondary | ICD-10-CM | POA: Insufficient documentation

## 2019-03-17 DIAGNOSIS — S0101XD Laceration without foreign body of scalp, subsequent encounter: Secondary | ICD-10-CM | POA: Insufficient documentation

## 2019-03-17 NOTE — ED Notes (Signed)
Patient verbalizes understanding of discharge instructions. Opportunity for questioning and answers were provided. Armband removed by staff, pt discharged from ED ambulatory to home.  

## 2019-03-17 NOTE — ED Triage Notes (Signed)
Pt here to have stitches removed from her nose and back of head
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
## 2019-03-17 NOTE — Discharge Instructions (Addendum)
You can apply topical scar cream to help minimize scarring. If you have any concerns regarding your nose, follow up with the ENT specialist, Dr. Jearld Fenton.

## 2019-03-22 ENCOUNTER — Ambulatory Visit (HOSPITAL_COMMUNITY)
Admission: EM | Admit: 2019-03-22 | Discharge: 2019-03-22 | Disposition: A | Discharge status: Home / Self Care | Payer: Self-pay | Attending: Family Medicine | Admitting: Family Medicine

## 2019-03-22 ENCOUNTER — Other Ambulatory Visit: Payer: Self-pay

## 2019-03-22 ENCOUNTER — Encounter (HOSPITAL_COMMUNITY): Payer: Self-pay

## 2019-03-22 DIAGNOSIS — R8290 Unspecified abnormal findings in urine: Secondary | ICD-10-CM | POA: Insufficient documentation

## 2019-03-22 DIAGNOSIS — Z87448 Personal history of other diseases of urinary system: Secondary | ICD-10-CM | POA: Insufficient documentation

## 2019-03-22 DIAGNOSIS — Z87898 Personal history of other specified conditions: Secondary | ICD-10-CM

## 2019-03-22 DIAGNOSIS — N898 Other specified noninflammatory disorders of vagina: Secondary | ICD-10-CM | POA: Insufficient documentation

## 2019-03-22 DIAGNOSIS — Z3202 Encounter for pregnancy test, result negative: Secondary | ICD-10-CM

## 2019-03-22 LAB — POCT PREGNANCY, URINE: Preg Test, Ur: NEGATIVE

## 2019-03-22 LAB — POCT URINALYSIS DIP (DEVICE)
Bilirubin Urine: NEGATIVE
Glucose, UA: NEGATIVE mg/dL
Hgb urine dipstick: NEGATIVE
Ketones, ur: NEGATIVE mg/dL
Nitrite: NEGATIVE
Protein, ur: NEGATIVE mg/dL
Specific Gravity, Urine: >=1.03 (ref 1.005–1.030)
Urobilinogen, UA: 0.2 mg/dL (ref 0.0–1.0)
pH: 6 (ref 5.0–8.0)

## 2019-03-22 MED ORDER — NITROFURANTOIN MONOHYD MACRO 100 MG PO CAPS: 100.0000 mg | ORAL_CAPSULE | Freq: Two times a day (BID) | ORAL | 0 refills | Status: DC

## 2019-03-22 MED ORDER — METRONIDAZOLE 500 MG PO TABS: 500.0000 mg | ORAL_TABLET | Freq: Two times a day (BID) | ORAL | 0 refills | Status: DC

## 2019-03-22 NOTE — Discharge Instructions (Signed)
Vaginal self-swab obtained Urine did show possible infection Macrobid prescribed.  Take as directed and to completion Prescribed metronidazole 500 mg twice daily for 7 days for possible bacterial vaginosis (do not take while consuming alcohol and/or if breastfeeding) We will follow up with you regarding the results of your test If tests are positive, please abstain from sexual activity for at least 7 days and notify partners Follow up with PCP or with [facility] if symptoms persists Return here or go to ER if you have any new or worsening symptoms fever, chills, nausea, vomiting, abdominal or pelvic pain, painful intercourse, vaginal discharge, vaginal bleeding, persistent symptoms despite treatment, etc..Marland Kitchen

## 2019-03-22 NOTE — ED Triage Notes (Signed)
Vaginal infection for month, treated last month in [place] no improvement,

## 2019-03-22 NOTE — ED Provider Notes (Signed)
[facility]ENTER   [id number] 03/22/19 Arrival Time: 1055   WF:[id number] itching, odor, and urine changes  SUBJECTIVE:  Caitlin Grant is a 23 y.o. female who presents with intermittent vaginal itching, odor, and changes in urine x >2 months.  Does admit to sexual activity prior to symptoms.  Has been sexually active with 2 female partners within the past 6 months. Was seen at an UC in [facility] and was treated for BV and UTI.  Was prescribed macrobid and metronidazole, but did not fill medications.  She reports similar symptoms in the past diagnosed with BV. Complains of nausea.  She denies fever, chills, vomiting, abdominal or pelvic pain, dysuria, urinary frequency or urgency, vaginal bleeding, dyspareunia, vaginal rashes or lesions.   Patient's last menstrual period was 03/08/2019. Current birth control method: None  ROS: As per HPI.  Past Medical History:  Diagnosis Date  . Depression   . Ectopic pregnancy    History reviewed. No pertinent surgical history. Allergies  Allergen Reactions  . Penicillins Shortness Of Breath and Rash    Did it involve swelling of the face/tongue/throat, SOB, or low BP? Yes Did it involve sudden or severe rash/hives, skin peeling, or any reaction on the inside of your mouth or nose? Unk Did you need to seek medical attention at a hospital or doctor's office? Yes When did it last happen? "I was a baby" If all above answers are "NO", may proceed with cephalosporin use.   . Latex Rash    Rash where latex makes contact   No current facility-administered medications on file prior to encounter.    No current outpatient medications on file prior to encounter.    Social History   Socioeconomic History  . Marital status: Single    Spouse name: Not on file  . Number of children: Not on file  . Years of education: Not on file  . Highest education level: Not on file  Occupational History  . Not on file  Social Needs  . Financial resource strain: Not  on file  . Food insecurity:    Worry: Not on file    Inability: Not on file  . Transportation needs:    Medical: Not on file    Non-medical: Not on file  Tobacco Use  . Smoking status: Current Every Day Smoker    Packs/day: 0.50    Types: Cigarettes  . Smokeless tobacco: Never Used  Substance and Sexual Activity  . Alcohol use: Yes    Comment: occ  . Drug use: Yes    Types: Marijuana    Comment: ectasy last use last night  . Sexual activity: Yes  Lifestyle  . Physical activity:    Days per week: Not on file    Minutes per session: Not on file  . Stress: Not on file  Relationships  . Social connections:    Talks on phone: Not on file    Gets together: Not on file    Attends religious service: Not on file    Active member of club or organization: Not on file    Attends meetings of clubs or organizations: Not on file    Relationship status: Not on file  . Intimate partner violence:    Fear of current or ex partner: Not on file    Emotionally abused: Not on file    Physically abused: Not on file    Forced sexual activity: Not on file  Other Topics Concern  . Not on file  Social History Narrative  . Not on file   No family history on file.  OBJECTIVE:  Vitals:   03/22/19 1107  BP: (!) 127/91  Pulse: (!) 110  Resp: 18  Temp: 98 F (36.7 C)  SpO2: 99%     General appearance: Alert, NAD, appears stated age Head: NCAT Throat: lips, mucosa, and tongue normal; teeth and gums normal Lungs: CTA bilaterally without adventitious breath sounds Heart: regular rate and rhythm.  Radial pulses 2+ symmetrical bilaterally Back: no CVA tenderness Abdomen: soft, non-tender; bowel sounds normal; no masses or organomegaly; no guarding or rebound tenderness GU: deferred Skin: warm and dry Psychological:  Alert and cooperative. Normal mood and affect.  LABS:   Results for orders placed or performed during the hospital encounter of 03/22/19 (from the past 24 hour(s))  POCT  urinalysis dip (device)     Status: Abnormal   Collection Time: 03/22/19 11:53 AM  Result Value Ref Range   Glucose, UA NEGATIVE NEGATIVE mg/dL   Bilirubin Urine NEGATIVE NEGATIVE   Ketones, ur NEGATIVE NEGATIVE mg/dL   Specific Gravity, Urine >=1.030 1.005 - 1.030   Hgb urine dipstick NEGATIVE NEGATIVE   pH 6.0 5.0 - 8.0   Protein, ur NEGATIVE NEGATIVE mg/dL   Urobilinogen, UA 0.2 0.0 - 1.0 mg/dL   Nitrite NEGATIVE NEGATIVE   Leukocytes,Ua TRACE (A) NEGATIVE  Pregnancy, urine POC     Status: None   Collection Time: 03/22/19 11:57 AM  Result Value Ref Range   Preg Test, Ur NEGATIVE NEGATIVE    ASSESSMENT & PLAN:  1. Vaginal itching   2. Vaginal odor   3. Abnormal urine odor   4. History of urine color changes     Meds ordered this encounter  Medications  . nitrofurantoin, macrocrystal-monohydrate, (MACROBID) 100 MG capsule    Sig: Take 1 capsule (100 mg total) by mouth 2 (two) times daily.    Dispense:  10 capsule    Refill:  0    Order Specific Question:   Supervising Provider    Answer:   Eustace MooreNELSON, YVONNE SUE [[id number]]  . metroNIDAZOLE (FLAGYL) 500 MG tablet    Sig: Take 1 tablet (500 mg total) by mouth 2 (two) times daily.    Dispense:  14 tablet    Refill:  0    Order Specific Question:   Supervising Provider    Answer:   Eustace MooreELSON, YVONNE SUE [[id number]]    Pending: Labs Reviewed  POCT URINALYSIS DIP (DEVICE) - Abnormal; Notable for the following components:      Result Value   Leukocytes,Ua TRACE (*)    All other components within normal limits  URINE CULTURE  POCT PREGNANCY, URINE  CERVICOVAGINAL ANCILLARY ONLY    Vaginal self-swab obtained Urine did show possible infection Macrobid prescribed.  Take as directed and to completion Prescribed metronidazole 500 mg twice daily for 7 days for possible bacterial vaginosis (do not take while consuming alcohol and/or if breastfeeding) We will follow up with you regarding the results of your test If tests are  positive, please abstain from sexual activity for at least 7 days and notify partners Follow up with PCP or with [facility] if symptoms persists Return here or go to ER if you have any new or worsening symptoms fever, chills, nausea, vomiting, abdominal or pelvic pain, painful intercourse, vaginal discharge, vaginal bleeding, persistent symptoms despite treatment, etc...  Reviewed expectations re: course of current medical issues. Questions answered. Outlined signs and symptoms indicating need for more acute  intervention. Patient verbalized understanding. After Visit Summary given.       Rennis Harding, PA-C 03/22/19 1212

## 2019-03-22 NOTE — ED Notes (Signed)
Pt given specimen cup, went in toilet, will give water

## 2019-03-22 NOTE — ED Notes (Signed)
Patient able to ambulate independently  

## 2019-03-23 LAB — CERVICOVAGINAL ANCILLARY ONLY
Bacterial vaginitis: NEGATIVE
Candida vaginitis: NEGATIVE
Chlamydia: NEGATIVE
Neisseria Gonorrhea: NEGATIVE
Trichomonas: NEGATIVE

## 2019-05-02 ENCOUNTER — Emergency Department (HOSPITAL_COMMUNITY)
Admission: EM | Admit: 2019-05-02 | Discharge: 2019-05-02 | Disposition: A | Discharge status: Home / Self Care | Payer: Medicaid Other | Attending: Emergency Medicine | Admitting: Emergency Medicine

## 2019-05-02 ENCOUNTER — Encounter (HOSPITAL_COMMUNITY): Payer: Self-pay | Admitting: Emergency Medicine

## 2019-05-02 ENCOUNTER — Other Ambulatory Visit: Payer: Self-pay

## 2019-05-02 DIAGNOSIS — F17210 Nicotine dependence, cigarettes, uncomplicated: Secondary | ICD-10-CM | POA: Diagnosis not present

## 2019-05-02 DIAGNOSIS — Z91040 Latex allergy status: Secondary | ICD-10-CM | POA: Diagnosis not present

## 2019-05-02 DIAGNOSIS — Z79899 Other long term (current) drug therapy: Secondary | ICD-10-CM | POA: Insufficient documentation

## 2019-05-02 DIAGNOSIS — N3091 Cystitis, unspecified with hematuria: Secondary | ICD-10-CM | POA: Insufficient documentation

## 2019-05-02 DIAGNOSIS — R300 Dysuria: Secondary | ICD-10-CM

## 2019-05-02 LAB — URINALYSIS, ROUTINE W REFLEX MICROSCOPIC
Bilirubin Urine: NEGATIVE
Glucose, UA: NEGATIVE mg/dL
Ketones, ur: NEGATIVE mg/dL
Nitrite: NEGATIVE
Protein, ur: 100 mg/dL — AB
RBC / HPF: >50 RBC/hpf — ABNORMAL HIGH (ref 0–5)
Specific Gravity, Urine: 1.01 (ref 1.005–1.030)
WBC, UA: >50 WBC/hpf — ABNORMAL HIGH (ref 0–5)
pH: 7 (ref 5.0–8.0)

## 2019-05-02 LAB — PREGNANCY, URINE: Preg Test, Ur: NEGATIVE

## 2019-05-02 MED ORDER — PHENAZOPYRIDINE HCL 200 MG PO TABS: 200.0000 mg | ORAL_TABLET | Freq: Three times a day (TID) | ORAL | 0 refills | Status: DC | PRN

## 2019-05-02 MED ORDER — CEPHALEXIN 500 MG PO CAPS
500.0000 mg | ORAL_CAPSULE | Freq: Once | ORAL | Status: AC
  Administered 2019-05-02: 500 mg via ORAL
  Filled 2019-05-02: qty 1

## 2019-05-02 MED ORDER — PHENAZOPYRIDINE HCL 200 MG PO TABS
200.0000 mg | ORAL_TABLET | Freq: Once | ORAL | Status: AC
  Administered 2019-05-02: 200 mg via ORAL
  Filled 2019-05-02: qty 1

## 2019-05-02 MED ORDER — CEPHALEXIN 500 MG PO CAPS: 500.0000 mg | ORAL_CAPSULE | Freq: Two times a day (BID) | ORAL | 0 refills | Status: DC

## 2019-05-02 NOTE — Discharge Instructions (Signed)
Take the prescribed medication as directed.  Continue drinking lots of water. The pyridium (brown pill) will turn your urine bright orange/red-- this is normal. Follow-up with your primary care doctor. Return to the ED for new or worsening symptoms.

## 2019-05-02 NOTE — ED Provider Notes (Signed)
[facility]-EMERGENCY DEPT Provider Note   CSN: [id number] Arrival date & time: 05/02/19  1947   [id number] History   Chief Complaint Chief Complaint  Patient presents with  . Flank Pain  . Hematuria    HPI Caitlin Grant is a 23 y.o. female.     The history is provided by the patient and medical records.     23 year old female with history of depression and prior ectopic pregnancy, presenting to the ED with concern of UTI.  She reports for the past several days she has had a lot of dysuria and urinary frequency.  She has started noticing some blood in her urine, states bright red in color but has been increasing in volume.  She also reports some bilateral flank pain.  She denies any fever, chills, nausea, or vomiting.  She has not had any pelvic pain or vaginal discharge.  States she has history of UTIs, gets these every 3 months or so.  States this has been occurring since she was a child.  She did try drinking some cranberry juice and increase water intake without much relief.  Past Medical History:  Diagnosis Date  . Depression   . Ectopic pregnancy     There are no active problems to display for this patient.   History reviewed. No pertinent surgical history.   OB History   No obstetric history on file.      Home Medications    Prior to Admission medications   Medication Sig Start Date End Date Taking? Authorizing Provider  metroNIDAZOLE (FLAGYL) 500 MG tablet Take 1 tablet (500 mg total) by mouth 2 (two) times daily. 03/22/19   Wurst, [place], PA-C  nitrofurantoin, macrocrystal-monohydrate, (MACROBID) 100 MG capsule Take 1 capsule (100 mg total) by mouth 2 (two) times daily. 03/22/19   Rennis HardingWurst, Brittany, PA-C    Family History History reviewed. No pertinent family history.  Social History Social History   Tobacco Use  . Smoking status: Current Every Day Smoker    Packs/day: 0.50    Types: Cigarettes  . Smokeless tobacco: Never Used  Substance  Use Topics  . Alcohol use: Yes    Comment: occ  . Drug use: Yes    Types: Marijuana    Comment: ectasy last use last night     Allergies   Penicillins and Latex   Review of Systems Review of Systems  Genitourinary: Positive for dysuria, flank pain and hematuria.  All other systems reviewed and are negative.    Physical Exam Updated Vital Signs BP 124/84 (BP Location: Left Arm)   Pulse (!) 111   Temp 98.5 F (36.9 C) (Oral)   Resp 16   Ht 5\' 8"  (1.727 m)   Wt 69.8 kg   LMP 04/02/2019   SpO2 100%   BMI 23.39 kg/m   Physical Exam Vitals signs and nursing note reviewed.  Constitutional:      Appearance: She is well-developed.     Comments: Very well appearing, NAD  HENT:     Head: Normocephalic and atraumatic.  Eyes:     Conjunctiva/sclera: Conjunctivae normal.     Pupils: Pupils are equal, round, and reactive to light.  Neck:     Musculoskeletal: Normal range of motion.  Cardiovascular:     Rate and Rhythm: Normal rate and regular rhythm.     Heart sounds: Normal heart sounds.  Pulmonary:     Effort: Pulmonary effort is normal.     Breath sounds: Normal breath sounds.  Abdominal:     General: Bowel sounds are normal.     Palpations: Abdomen is soft.     Comments: Soft, non-tender, endorses bilateral flank pain but no CVA tenderness noted  Musculoskeletal: Normal range of motion.  Skin:    General: Skin is warm and dry.  Neurological:     Mental Status: She is alert and oriented to person, place, and time.      ED Treatments / Results  Labs (all labs ordered are listed, but only abnormal results are displayed) Labs Reviewed  URINALYSIS, ROUTINE W REFLEX MICROSCOPIC - Abnormal; Notable for the following components:      Result Value   APPearance CLOUDY (*)    Hgb urine dipstick MODERATE (*)    Protein, ur 100 (*)    Leukocytes,Ua LARGE (*)    RBC / HPF >50 (*)    WBC, UA >50 (*)    Bacteria, UA RARE (*)    All other components within normal  limits  PREGNANCY, URINE    EKG None  Radiology No results found.  Procedures Procedures (including critical care time)  Medications Ordered in ED Medications - No data to display   Initial Impression / Assessment and Plan / ED Course  I have reviewed the triage vital signs and the nursing notes.  Pertinent labs & imaging results that were available during my care of the patient were reviewed by me and considered in my medical decision making (see chart for details).  23 year old female with several days of dysuria, urinary frequency, and hematuria.  Reports long history of UTIs dating back to childhood.  She is afebrile and nontoxic.  Her abdomen is soft and benign.  She endorses flank pain but does not have any CVA tenderness on exam.  UA does appear infectious.  Given her overall well appearance and lack of physical exam findings, do not feel she needs blood work or emergent imaging.  No signs/symptoms suggestive of urosepsis.  Will treat with course of keflex and pyridium.  Can follow-up with PCP.  Return here for any new/acute changes.  Final Clinical Impressions(s) / ED Diagnoses   Final diagnoses:  Dysuria  Hematuria due to cystitis    ED Discharge Orders         Ordered    cephALEXin (KEFLEX) 500 MG capsule  2 times daily     05/02/19 2311    phenazopyridine (PYRIDIUM) 200 MG tablet  3 times daily PRN     05/02/19 2311           Garlon Hatchet, PA-C 05/02/19 2317    Linwood Dibbles, MD 05/05/19 [phone]

## 2019-05-02 NOTE — ED Triage Notes (Signed)
Pt reports abdominal pain along with flank pain for the last 5 days and has blood in urine.
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
## 2019-08-30 ENCOUNTER — Ambulatory Visit (HOSPITAL_COMMUNITY)
Admission: EM | Admit: 2019-08-30 | Discharge: 2019-08-30 | Disposition: A | Discharge status: Home / Self Care | Payer: Medicaid Other | Attending: Emergency Medicine | Admitting: Emergency Medicine

## 2019-08-30 ENCOUNTER — Encounter (HOSPITAL_COMMUNITY): Payer: Self-pay | Admitting: Emergency Medicine

## 2019-08-30 ENCOUNTER — Other Ambulatory Visit: Payer: Self-pay

## 2019-08-30 DIAGNOSIS — R8290 Unspecified abnormal findings in urine: Secondary | ICD-10-CM

## 2019-08-30 DIAGNOSIS — S00411A Abrasion of right ear, initial encounter: Secondary | ICD-10-CM | POA: Insufficient documentation

## 2019-08-30 DIAGNOSIS — Z3201 Encounter for pregnancy test, result positive: Secondary | ICD-10-CM

## 2019-08-30 LAB — POCT URINALYSIS DIP (DEVICE)
Bilirubin Urine: NEGATIVE
Glucose, UA: NEGATIVE mg/dL
Ketones, ur: NEGATIVE mg/dL
Leukocytes,Ua: NEGATIVE
Nitrite: NEGATIVE
Protein, ur: NEGATIVE mg/dL
Specific Gravity, Urine: >=1.03 (ref 1.005–1.030)
Urobilinogen, UA: 0.2 mg/dL (ref 0.0–1.0)
pH: 6 (ref 5.0–8.0)

## 2019-08-30 LAB — POCT PREGNANCY, URINE: Preg Test, Ur: POSITIVE — AB

## 2019-08-30 MED ORDER — NEOMYCIN-POLYMYXIN-HC 3.5-10000-1 OT SUSP: 4.0000 [drp] | Freq: Three times a day (TID) | OTIC | 0 refills | Status: DC

## 2019-08-30 NOTE — Discharge Instructions (Signed)
Use the eardrops as prescribed.    Your urine did not show signs of an infection.    Follow-up as scheduled with your OB/GYN.

## 2019-08-30 NOTE — ED Triage Notes (Addendum)
Patient reports ears, particularly right ear is itching, burning  Patient reports urine is cloudy for 2 weeks.  Denies pain with urination  Patient goes for first ob appt this coming week

## 2019-08-30 NOTE — ED Provider Notes (Signed)
[facility]    CSN: [id number] Arrival date & time: 08/30/19  1515      History   Chief Complaint Chief Complaint  Patient presents with  . Otalgia    HPI Caitlin Grant is a 23 y.o. female.   Patient presents with right ear canal itching and burning intermittently x several weeks.  She denies fever, chills, sore throat, cough, shortness of breath, or other symptoms.  She also reports "cloudy" urine but denies dysuria, frequency, urgency, or other symptoms.  Patient is [redacted] weeks pregnant.  The history is provided by the patient.    Past Medical History:  Diagnosis Date  . Depression   . Ectopic pregnancy     There are no active problems to display for this patient.   Past Surgical History:  Procedure Laterality Date  . TONSILLECTOMY    . WISDOM TOOTH EXTRACTION      OB History   No obstetric history on file.      Home Medications    Prior to Admission medications   Medication Sig Start Date End Date Taking? Authorizing Provider  Prenatal Vit-Fe Fumarate-FA (PRENATAL VITAMINS PO) Take by mouth.   Yes [provider]  cephALEXin (KEFLEX) 500 MG capsule Take 1 capsule (500 mg total) by mouth 2 (two) times daily. 05/02/19   Garlon Hatchet, PA-C  metroNIDAZOLE (FLAGYL) 500 MG tablet Take 1 tablet (500 mg total) by mouth 2 (two) times daily. 03/22/19   Wurst, [place], PA-C  neomycin-polymyxin-hydrocortisone (CORTISPORIN) 3.5-10000-1 OTIC suspension Place 4 drops into the right ear 3 (three) times daily. 08/30/19   Mickie Bail, NP  nitrofurantoin, macrocrystal-monohydrate, (MACROBID) 100 MG capsule Take 1 capsule (100 mg total) by mouth 2 (two) times daily. 03/22/19   Wurst, [place], PA-C  phenazopyridine (PYRIDIUM) 200 MG tablet Take 1 tablet (200 mg total) by mouth 3 (three) times daily as needed for pain. 05/02/19   Garlon Hatchet, PA-C    Family History History reviewed. No pertinent family history.  Social History Social History   Tobacco  Use  . Smoking status: Current Every Day Smoker    Packs/day: 0.50    Types: Cigarettes  . Smokeless tobacco: Never Used  Substance Use Topics  . Alcohol use: Yes    Comment: occ  . Drug use: Yes    Types: Marijuana    Comment: ectasy last use last night     Allergies   Penicillins and Latex   Review of Systems Review of Systems  Constitutional: Negative for chills and fever.  HENT: Positive for ear pain. Negative for congestion, rhinorrhea and sore throat.   Eyes: Negative for pain and visual disturbance.  Respiratory: Negative for cough and shortness of breath.   Cardiovascular: Negative for chest pain and palpitations.  Gastrointestinal: Negative for abdominal pain, diarrhea and vomiting.  Genitourinary: Negative for dysuria, frequency, hematuria and urgency.  Musculoskeletal: Negative for arthralgias and back pain.  Skin: Negative for color change and rash.  Neurological: Negative for seizures and syncope.  All other systems reviewed and are negative.    Physical Exam Triage Vital Signs ED Triage Vitals [08/30/19 1530]  Enc Vitals Group     BP 101/70     Pulse Rate 96     Resp 17     Temp 98 F (36.7 C)     Temp Source Oral     SpO2 100 %     Weight      Height  Head Circumference      Peak Flow      Pain Score      Pain Loc      Pain Edu?      Excl. in [place]?    No data found.  Updated Vital Signs BP 101/70 (BP Location: Left Arm)   Pulse 96   Temp 98 F (36.7 C) (Oral)   Resp 17   LMP 05/05/2019   SpO2 100%   Visual Acuity Right Eye Distance:   Left Eye Distance:   Bilateral Distance:    Right Eye Near:   Left Eye Near:    Bilateral Near:     Physical Exam Vitals signs and nursing note reviewed.  Constitutional:      General: She is not in acute distress.    Appearance: She is well-developed.  HENT:     Head: Normocephalic and atraumatic.     Right Ear: Tympanic membrane normal.     Left Ear: Tympanic membrane normal.     Ears:      Comments: Small abrasion in right ear canal.  No active bleeding or drainage.     Nose: Nose normal.     Mouth/Throat:     Mouth: Mucous membranes are moist.     Pharynx: Oropharynx is clear.  Eyes:     Conjunctiva/sclera: Conjunctivae normal.  Neck:     Musculoskeletal: Neck supple.  Cardiovascular:     Rate and Rhythm: Normal rate and regular rhythm.     Heart sounds: No murmur.  Pulmonary:     Effort: Pulmonary effort is normal. No respiratory distress.     Breath sounds: Normal breath sounds.  Abdominal:     General: Bowel sounds are normal.     Palpations: Abdomen is soft.     Tenderness: There is no abdominal tenderness. There is no right CVA tenderness or left CVA tenderness.  Skin:    General: Skin is warm and dry.     Findings: No rash.  Neurological:     Mental Status: She is alert.      UC Treatments / Results  Labs (all labs ordered are listed, but only abnormal results are displayed) Labs Reviewed  POCT URINALYSIS DIP (DEVICE) - Abnormal; Notable for the following components:      Result Value   Hgb urine dipstick TRACE (*)    All other components within normal limits  POCT PREGNANCY, URINE - Abnormal; Notable for the following components:   Preg Test, Ur POSITIVE (*)    All other components within normal limits  URINE CULTURE    EKG   Radiology No results found.  Procedures Procedures (including critical care time)  Medications Ordered in UC Medications - No data to display  Initial Impression / Assessment and Plan / UC Course  I have reviewed the triage vital signs and the nursing notes.  Pertinent labs & imaging results that were available during my care of the patient were reviewed by me and considered in my medical decision making (see chart for details).   Right ear canal abrasion.  Subjective report of cloudy urine.  Treating ear canal with Cortisporin drops.  Urine dip negative for LE or nitrites; positive for trace blood; urine  culture pending.  Instructed patient to follow-up with her OB/GYN as scheduled next week.  Instructed patient to return here or go to the emergency department if she has dysuria, frequency, urgency, fever, chills, or other concerns.  Patient agrees to plan of care.  Final Clinical Impressions(s) / UC Diagnoses   Final diagnoses:  Abrasion of ear canal, right, initial encounter  Cloudy urine     Discharge Instructions     Use the eardrops as prescribed.    Your urine did not show signs of an infection.    Follow-up as scheduled with your OB/GYN.      ED Prescriptions    Medication Sig Dispense Auth. Provider   neomycin-polymyxin-hydrocortisone (CORTISPORIN) 3.5-10000-1 OTIC suspension Place 4 drops into the right ear 3 (three) times daily. 10 mL Sharion Balloon, NP     Controlled Substance Prescriptions [place] Controlled Substance Registry consulted? Not Applicable   Sharion Balloon, NP 08/30/19 1624

## 2019-08-30 NOTE — ED Notes (Signed)
Urine placed in lab 

## 2019-09-01 LAB — URINE CULTURE: Culture: NO GROWTH

## 2019-09-06 LAB — OB RESULTS CONSOLE HEPATITIS B SURFACE ANTIGEN: Hepatitis B Surface Ag: NEGATIVE

## 2019-09-06 LAB — OB RESULTS CONSOLE RUBELLA ANTIBODY, IGM: Rubella: IMMUNE

## 2019-09-06 LAB — OB RESULTS CONSOLE HIV ANTIBODY (ROUTINE TESTING): HIV: NONREACTIVE

## 2019-09-06 LAB — OB RESULTS CONSOLE RPR: RPR: NONREACTIVE

## 2019-10-28 ENCOUNTER — Encounter (HOSPITAL_COMMUNITY): Payer: Self-pay

## 2019-10-28 ENCOUNTER — Ambulatory Visit (HOSPITAL_COMMUNITY)
Admission: EM | Admit: 2019-10-28 | Discharge: 2019-10-28 | Disposition: A | Discharge status: Home / Self Care | Payer: Medicaid Other | Attending: Family Medicine | Admitting: Family Medicine

## 2019-10-28 ENCOUNTER — Other Ambulatory Visit: Payer: Self-pay

## 2019-10-28 DIAGNOSIS — B009 Herpesviral infection, unspecified: Secondary | ICD-10-CM | POA: Diagnosis present

## 2019-10-28 DIAGNOSIS — N898 Other specified noninflammatory disorders of vagina: Secondary | ICD-10-CM

## 2019-10-28 MED ORDER — ACYCLOVIR 400 MG PO TABS: 400.0000 mg | ORAL_TABLET | Freq: Three times a day (TID) | ORAL | 0 refills | Status: AC

## 2019-10-28 MED ORDER — METRONIDAZOLE 0.75 % VA GEL: 1.0000 | Freq: Every day | VAGINAL | 0 refills | Status: AC

## 2019-10-28 NOTE — ED Triage Notes (Signed)
Patient presents to Urgent Care with complaints of rash on her vagina since 3-4 days ago. Patient reports she has been using wipes on her vagina, pt is [redacted] weeks pregnant.

## 2019-10-28 NOTE — Discharge Instructions (Signed)
Treating you for bacterial vaginosis and a herpes flareup. Take the medication as prescribed

## 2019-10-31 NOTE — ED Provider Notes (Signed)
[facility]    CSN: [id number] Arrival date & time: 10/28/19  1222      History   Chief Complaint Chief Complaint  Patient presents with  . vaginal rash    HPI Nelani Schmelzle is a 23 y.o. female.   Patient is a 23 year old female that presents today with rash on her vaginal area x3 to 4 days.  She is also had some vaginal discharge with odor.  Symptoms have been constant.  Reporting she has been using some new wipes on the vaginal area for cleaning.  These burns when she uses them.  Patient is approximately [redacted] weeks pregnant.  Recent sexual encounter with previous sexual partner unprotected.  Symptoms started shortly after that.  No abdominal pain, fevers, dysuria, hematuria or urinary frequency.  ROS per HPI      Past Medical History:  Diagnosis Date  . Depression   . Ectopic pregnancy     There are no active problems to display for this patient.   Past Surgical History:  Procedure Laterality Date  . TONSILLECTOMY    . WISDOM TOOTH EXTRACTION      OB History    Gravida  1   Para      Term      Preterm      AB      Living        SAB      TAB      Ectopic      Multiple      Live Births               Home Medications    Prior to Admission medications   Medication Sig Start Date End Date Taking? Authorizing Provider  Prenatal Vit-Fe Fumarate-FA (PRENATAL VITAMINS PO) Take by mouth.   Yes [provider]  acyclovir (ZOVIRAX) 400 MG tablet Take 1 tablet (400 mg total) by mouth 3 (three) times daily for 5 days. 10/28/19 11/02/19  Dahlia Byes A, NP  metroNIDAZOLE (METROGEL) 0.75 % vaginal gel Place 1 Applicatorful vaginally at bedtime for 5 days. 10/28/19 11/02/19  Janace Aris, NP    Family History Family History  Problem Relation Age of Onset  . Healthy Mother   . Healthy Father     Social History Social History   Tobacco Use  . Smoking status: Former Smoker    Packs/day: 0.50    Types: Cigarettes  .  Smokeless tobacco: Never Used  . Tobacco comment: not while pregnant  Substance Use Topics  . Alcohol use: Not Currently    Comment: occ  . Drug use: Yes    Types: Marijuana    Comment: ectasy last use last night     Allergies   Penicillins and Latex   Review of Systems Review of Systems   Physical Exam Triage Vital Signs ED Triage Vitals  Enc Vitals Group     BP 10/28/19 1311 104/70     Pulse Rate 10/28/19 1311 100     Resp 10/28/19 1311 16     Temp 10/28/19 1311 98.2 F (36.8 C)     Temp Source 10/28/19 1311 Oral     SpO2 10/28/19 1311 100 %     Weight --      Height --      Head Circumference --      Peak Flow --      Pain Score 10/28/19 1308 1     Pain Loc --  Pain Edu? --      Excl. in [place]? --    No data found.  Updated Vital Signs BP 104/70 (BP Location: Left Arm)   Pulse 100   Temp 98.2 F (36.8 C) (Oral)   Resp 16   LMP 05/05/2019   SpO2 100%   Visual Acuity Right Eye Distance:   Left Eye Distance:   Bilateral Distance:    Right Eye Near:   Left Eye Near:    Bilateral Near:     Physical Exam Vitals signs and nursing note reviewed.  Constitutional:      General: She is not in acute distress.    Appearance: Normal appearance. She is not ill-appearing, toxic-appearing or diaphoretic.  HENT:     Head: Normocephalic.     Nose: Nose normal.     Mouth/Throat:     Pharynx: Oropharynx is clear.  Eyes:     Conjunctiva/sclera: Conjunctivae normal.  Neck:     Musculoskeletal: Normal range of motion.  Pulmonary:     Effort: Pulmonary effort is normal.  Abdominal:     Palpations: Abdomen is soft.     Tenderness: There is no abdominal tenderness.  Genitourinary:    Vagina: Vaginal discharge present.     Comments: Thin, watery, malodorous discharge noted to external vaginal area and near vaginal opening. 5 or 6 vesicles to left labia majora with mild erythema and tender to touch. No drainage from the rash Musculoskeletal: Normal range of  motion.  Skin:    General: Skin is warm and dry.     Findings: No rash.  Neurological:     Mental Status: She is alert.  Psychiatric:        Mood and Affect: Mood normal.      UC Treatments / Results  Labs (all labs ordered are listed, but only abnormal results are displayed) Labs Reviewed  CERVICOVAGINAL ANCILLARY ONLY    EKG   Radiology No results found.  Procedures Procedures (including critical care time)  Medications Ordered in UC Medications - No data to display  Initial Impression / Assessment and Plan / UC Course  I have reviewed the triage vital signs and the nursing notes.  Pertinent labs & imaging results that were available during my care of the patient were reviewed by me and considered in my medical decision making (see chart for details).     Vaginal discharge-consistent with BV.  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 Take the medication as prescribed    ED Prescriptions    Medication Sig Dispense Auth. Provider   metroNIDAZOLE (METROGEL) 0.75 % vaginal gel Place 1 Applicatorful vaginally at bedtime for 5 days. 50 g Nijae Doyel A, NP   acyclovir (ZOVIRAX) 400 MG tablet Take 1 tablet (400 mg total) by mouth 3 (three) times daily for 5 days. 15 tablet Iylah Dworkin A, NP     PDMP not reviewed this encounter.   Orvan July, NP 10/31/19 1131

## 2019-11-01 LAB — CERVICOVAGINAL ANCILLARY ONLY
Bacterial vaginitis: NEGATIVE
Candida vaginitis: POSITIVE — AB
Chlamydia: NEGATIVE
Neisseria Gonorrhea: NEGATIVE
Trichomonas: NEGATIVE

## 2019-11-02 ENCOUNTER — Telehealth (HOSPITAL_COMMUNITY): Payer: Self-pay | Admitting: Emergency Medicine

## 2019-11-02 MED ORDER — TERCONAZOLE 0.4 % VA CREA: 1.0000 | TOPICAL_CREAM | Freq: Every day | VAGINAL | 0 refills | Status: AC

## 2019-11-02 NOTE — Telephone Encounter (Signed)
Test for candida (yeast) was positive.  Prescription for terconazole (pt is pregnant),  sent to the pharmacy of record.  Recheck or followup with PCP for further evaluation if symptoms are not improving.    Attempted to reach patient. No answer at this time. Call cannot be completed.

## 2019-11-04 ENCOUNTER — Telehealth: Payer: Self-pay | Admitting: Emergency Medicine

## 2019-11-04 NOTE — Telephone Encounter (Signed)
Attempted to reach patient x2. No answer at this time. Call cannot be completed.   

## 2019-11-16 DIAGNOSIS — O26893 Other specified pregnancy related conditions, third trimester: Secondary | ICD-10-CM | POA: Diagnosis not present

## 2019-11-16 DIAGNOSIS — Z3689 Encounter for other specified antenatal screening: Secondary | ICD-10-CM | POA: Diagnosis not present

## 2019-11-16 DIAGNOSIS — Z3A28 28 weeks gestation of pregnancy: Secondary | ICD-10-CM | POA: Diagnosis not present

## 2019-11-16 DIAGNOSIS — N898 Other specified noninflammatory disorders of vagina: Secondary | ICD-10-CM | POA: Diagnosis not present

## 2019-12-03 DIAGNOSIS — J3489 Other specified disorders of nose and nasal sinuses: Secondary | ICD-10-CM | POA: Diagnosis not present

## 2019-12-03 DIAGNOSIS — Z20828 Contact with and (suspected) exposure to other viral communicable diseases: Secondary | ICD-10-CM | POA: Diagnosis not present

## 2019-12-05 DIAGNOSIS — R05 Cough: Secondary | ICD-10-CM | POA: Diagnosis not present

## 2019-12-05 DIAGNOSIS — Z20828 Contact with and (suspected) exposure to other viral communicable diseases: Secondary | ICD-10-CM | POA: Diagnosis not present

## 2019-12-12 DIAGNOSIS — Z03818 Encounter for observation for suspected exposure to other biological agents ruled out: Secondary | ICD-10-CM | POA: Diagnosis not present

## 2019-12-12 DIAGNOSIS — Z1159 Encounter for screening for other viral diseases: Secondary | ICD-10-CM | POA: Diagnosis not present

## 2019-12-12 DIAGNOSIS — Z23 Encounter for immunization: Secondary | ICD-10-CM | POA: Diagnosis not present

## 2019-12-13 DIAGNOSIS — Z03818 Encounter for observation for suspected exposure to other biological agents ruled out: Secondary | ICD-10-CM | POA: Diagnosis not present

## 2019-12-13 DIAGNOSIS — F331 Major depressive disorder, recurrent, moderate: Secondary | ICD-10-CM | POA: Diagnosis not present

## 2019-12-13 DIAGNOSIS — Z20828 Contact with and (suspected) exposure to other viral communicable diseases: Secondary | ICD-10-CM | POA: Diagnosis not present

## 2019-12-13 DIAGNOSIS — Z1159 Encounter for screening for other viral diseases: Secondary | ICD-10-CM | POA: Diagnosis not present

## 2019-12-14 DIAGNOSIS — Z20828 Contact with and (suspected) exposure to other viral communicable diseases: Secondary | ICD-10-CM | POA: Diagnosis not present

## 2019-12-14 DIAGNOSIS — Z03818 Encounter for observation for suspected exposure to other biological agents ruled out: Secondary | ICD-10-CM | POA: Diagnosis not present

## 2019-12-16 NOTE — L&D Delivery Note (Signed)
OB/GYN Faculty Practice Delivery Note  Caitlin Grant is a 24 y.o. [id number] s/p VD at [redacted]w[redacted]d. She was admitted for SOL and possible SROM.   ROM: 17h 89m with clear fluid GBS Status: Negative/-- (01/25 0000) Maximum Maternal Temperature: 98.28F  Labor Progress: . Initial SVE: 1.5/50/-2. Patient received Pitocin, AROM and epidural. She then progressed to complete.   Delivery Date/Time: 2/14 @ [phone] Delivery: Called to room and patient was complete and pushing. Head delivered in ROA position. No nuchal cord present. Shoulder and body delivered in usual fashion. Infant with spontaneous cry, placed on mother's abdomen, dried and stimulated. Cord clamped x 2 after 1-minute delay, and cut by FOB. Cord blood drawn. Placenta delivered spontaneously with gentle cord traction. Fundus firm with massage and Pitocin. Labia, perineum, vagina, and cervix inspected inspected with small bilateral periurethral lacerations which were repaired with 4-0 Vicryl in a standard fashion.  Baby Weight: pending  Placenta: Sent to L&D Complications: None Lacerations: bilateral periurethral EBL: 161 mL Analgesia: Epidural   Infant: APGAR (1 MIN): 8   APGAR (5 MINS): 9   APGAR (10 MINS):     Jerilynn Birkenhead, MD OB Family Medicine Fellow, [facility] for [facility], [facility] 01/29/2020, 5:28 AM

## 2020-01-01 DIAGNOSIS — O99891 Other specified diseases and conditions complicating pregnancy: Secondary | ICD-10-CM | POA: Diagnosis not present

## 2020-01-01 DIAGNOSIS — N898 Other specified noninflammatory disorders of vagina: Secondary | ICD-10-CM | POA: Diagnosis not present

## 2020-01-01 DIAGNOSIS — Z3A35 35 weeks gestation of pregnancy: Secondary | ICD-10-CM | POA: Diagnosis not present

## 2020-01-09 DIAGNOSIS — Z3685 Encounter for antenatal screening for Streptococcus B: Secondary | ICD-10-CM | POA: Diagnosis not present

## 2020-01-09 DIAGNOSIS — O358XX0 Maternal care for other (suspected) fetal abnormality and damage, not applicable or unspecified: Secondary | ICD-10-CM | POA: Diagnosis not present

## 2020-01-09 DIAGNOSIS — Z3A36 36 weeks gestation of pregnancy: Secondary | ICD-10-CM | POA: Diagnosis not present

## 2020-01-09 DIAGNOSIS — Z3689 Encounter for other specified antenatal screening: Secondary | ICD-10-CM | POA: Diagnosis not present

## 2020-01-09 LAB — OB RESULTS CONSOLE GC/CHLAMYDIA
Chlamydia: NEGATIVE
Gonorrhea: NEGATIVE

## 2020-01-09 LAB — OB RESULTS CONSOLE GBS: GBS: NEGATIVE

## 2020-01-23 DIAGNOSIS — Z3A38 38 weeks gestation of pregnancy: Secondary | ICD-10-CM | POA: Diagnosis not present

## 2020-01-23 DIAGNOSIS — Z3689 Encounter for other specified antenatal screening: Secondary | ICD-10-CM | POA: Diagnosis not present

## 2020-01-27 ENCOUNTER — Encounter (HOSPITAL_COMMUNITY): Payer: Self-pay | Admitting: Obstetrics & Gynecology

## 2020-01-27 ENCOUNTER — Inpatient Hospital Stay (HOSPITAL_COMMUNITY)
Admission: AD | Admit: 2020-01-27 | Discharge: 2020-01-27 | Disposition: A | Discharge status: Home / Self Care | Payer: Medicaid Other | Attending: Obstetrics & Gynecology | Admitting: Obstetrics & Gynecology

## 2020-01-27 DIAGNOSIS — N898 Other specified noninflammatory disorders of vagina: Secondary | ICD-10-CM

## 2020-01-27 DIAGNOSIS — O26899 Other specified pregnancy related conditions, unspecified trimester: Secondary | ICD-10-CM | POA: Diagnosis not present

## 2020-01-27 DIAGNOSIS — Z3A39 39 weeks gestation of pregnancy: Secondary | ICD-10-CM | POA: Diagnosis not present

## 2020-01-27 DIAGNOSIS — O26893 Other specified pregnancy related conditions, third trimester: Secondary | ICD-10-CM

## 2020-01-27 LAB — WET PREP, GENITAL
Sperm: NONE SEEN
Trich, Wet Prep: NONE SEEN
Yeast Wet Prep HPF POC: NONE SEEN

## 2020-01-27 LAB — POCT FERN TEST: POCT Fern Test: NEGATIVE

## 2020-01-27 NOTE — Discharge Instructions (Signed)

## 2020-01-27 NOTE — MAU Note (Signed)
Pt presents to MAU with c/o PROM. Felt large gush of fluid this morning around 10 am, since then has continued to leak. Pt denies VB and no pain. +FM

## 2020-01-27 NOTE — MAU Provider Note (Addendum)
S: Ms. Caitlin Grant is a 24 y.o. Q3F3545 at [redacted]w[redacted]d  who presents to MAU today complaining of leaking of fluid since 10 am She denies vaginal bleeding. She denies contractions. She reports normal fetal movement.   She has a history of HSV-2 and takes Valtrex daily.   O: BP 133/79 (BP Location: Left Arm)   Pulse (!) 102   Temp 98.1 F (36.7 C) (Oral)   Resp 18   LMP 05/05/2019   SpO2 99%  GENERAL: Well-developed, well-nourished female in no acute distress.  HEAD: Normocephalic, atraumatic.  CHEST: Normal effort of breathing, regular heart rate ABDOMEN: Soft, nontender, gravid PELVIC: Normal external female genitalia. Vagina is pink and rugated. Cervix with normal contour, no lesions. Normal discharge.  No pooling. No visible blistering or lesions on vaginal walls or labia.   Cervical exam: closed, thick, middle position     Fetal Monitoring: Baseline: 140 Variability: Mod  Accelerations: present Decelerations: decels Contractions: q 5  Results for orders placed or performed during the hospital encounter of 01/27/20 (from the past 24 hour(s))  Wet prep, genital     Status: Abnormal   Collection Time: 01/27/20 12:04 PM   Specimen: Cervix  Result Value Ref Range   Yeast Wet Prep HPF POC NONE SEEN NONE SEEN   Trich, Wet Prep NONE SEEN NONE SEEN   Clue Cells Wet Prep HPF POC PRESENT (A) NONE SEEN   WBC, Wet Prep HPF POC MANY (A) NONE SEEN   Sperm NONE SEEN      A: SIUP at [redacted]w[redacted]d  Membranes intact Fern negative  No evidence of HSV outbreak at this time.  P: Discharge home  Marylene Land, [place] 01/27/2020 1:16 PM

## 2020-01-28 ENCOUNTER — Other Ambulatory Visit: Payer: Self-pay

## 2020-01-28 ENCOUNTER — Inpatient Hospital Stay (HOSPITAL_COMMUNITY)
Admission: AD | Admit: 2020-01-28 | Discharge: 2020-01-31 | DRG: 806 | Disposition: A | Discharge status: Home / Self Care | Payer: Medicaid Other | Attending: Obstetrics and Gynecology | Admitting: Obstetrics and Gynecology

## 2020-01-28 ENCOUNTER — Encounter (HOSPITAL_COMMUNITY): Payer: Self-pay | Admitting: Obstetrics and Gynecology

## 2020-01-28 DIAGNOSIS — Z8619 Personal history of other infectious and parasitic diseases: Secondary | ICD-10-CM

## 2020-01-28 DIAGNOSIS — Z3A39 39 weeks gestation of pregnancy: Secondary | ICD-10-CM

## 2020-01-28 DIAGNOSIS — O4290 Premature rupture of membranes, unspecified as to length of time between rupture and onset of labor, unspecified weeks of gestation: Secondary | ICD-10-CM | POA: Diagnosis present

## 2020-01-28 DIAGNOSIS — F329 Major depressive disorder, single episode, unspecified: Secondary | ICD-10-CM

## 2020-01-28 DIAGNOSIS — O9832 Other infections with a predominantly sexual mode of transmission complicating childbirth: Secondary | ICD-10-CM | POA: Diagnosis present

## 2020-01-28 DIAGNOSIS — F32A Depression, unspecified: Secondary | ICD-10-CM

## 2020-01-28 DIAGNOSIS — Z20822 Contact with and (suspected) exposure to covid-19: Secondary | ICD-10-CM | POA: Diagnosis present

## 2020-01-28 DIAGNOSIS — O479 False labor, unspecified: Secondary | ICD-10-CM | POA: Diagnosis not present

## 2020-01-28 DIAGNOSIS — O99344 Other mental disorders complicating childbirth: Secondary | ICD-10-CM | POA: Diagnosis present

## 2020-01-28 DIAGNOSIS — A6000 Herpesviral infection of urogenital system, unspecified: Secondary | ICD-10-CM | POA: Diagnosis present

## 2020-01-28 DIAGNOSIS — Z87891 Personal history of nicotine dependence: Secondary | ICD-10-CM | POA: Diagnosis not present

## 2020-01-28 DIAGNOSIS — I10 Essential (primary) hypertension: Secondary | ICD-10-CM | POA: Diagnosis not present

## 2020-01-28 DIAGNOSIS — O4292 Full-term premature rupture of membranes, unspecified as to length of time between rupture and onset of labor: Principal | ICD-10-CM | POA: Diagnosis present

## 2020-01-28 DIAGNOSIS — Z880 Allergy status to penicillin: Secondary | ICD-10-CM

## 2020-01-28 DIAGNOSIS — O26893 Other specified pregnancy related conditions, third trimester: Secondary | ICD-10-CM | POA: Diagnosis not present

## 2020-01-28 DIAGNOSIS — N898 Other specified noninflammatory disorders of vagina: Secondary | ICD-10-CM | POA: Diagnosis not present

## 2020-01-28 DIAGNOSIS — R52 Pain, unspecified: Secondary | ICD-10-CM | POA: Diagnosis not present

## 2020-01-28 LAB — CBC
HCT: 38.1 % (ref 36.0–46.0)
Hemoglobin: 14 g/dL (ref 12.0–15.0)
MCH: 29.5 pg (ref 26.0–34.0)
MCHC: 36.7 g/dL — ABNORMAL HIGH (ref 30.0–36.0)
MCV: 80.4 fL (ref 80.0–100.0)
Platelets: 247 10*3/uL (ref 150–400)
RBC: 4.74 MIL/uL (ref 3.87–5.11)
RDW: 13.6 % (ref 11.5–15.5)
WBC: 8.4 10*3/uL (ref 4.0–10.5)
nRBC: 0 % (ref 0.0–0.2)

## 2020-01-28 LAB — TYPE AND SCREEN
ABO/RH(D): A POS
Antibody Screen: NEGATIVE

## 2020-01-28 LAB — SARS CORONAVIRUS 2 (TAT 6-24 HRS): SARS Coronavirus 2: NEGATIVE

## 2020-01-28 LAB — ABO/RH: ABO/RH(D): A POS

## 2020-01-28 MED ORDER — LIDOCAINE HCL (PF) 1 % IJ SOLN: 30.0000 mL | INTRAMUSCULAR | Status: DC | PRN

## 2020-01-28 MED ORDER — ONDANSETRON HCL 4 MG/2ML IJ SOLN: 4.0000 mg | Freq: Four times a day (QID) | INTRAMUSCULAR | Status: DC | PRN

## 2020-01-28 MED ORDER — LACTATED RINGERS IV SOLN: INTRAVENOUS | Status: DC

## 2020-01-28 MED ORDER — OXYTOCIN 40 UNITS IN NORMAL SALINE INFUSION - SIMPLE MED
2.5000 [IU]/h | INTRAVENOUS | Status: DC
  Filled 2020-01-28: qty 1000

## 2020-01-28 MED ORDER — SOD CITRATE-CITRIC ACID 500-334 MG/5ML PO SOLN: 30.0000 mL | ORAL | Status: DC | PRN

## 2020-01-28 MED ORDER — FLEET ENEMA 7-19 GM/118ML RE ENEM: 1.0000 | ENEMA | RECTAL | Status: DC | PRN

## 2020-01-28 MED ORDER — OXYTOCIN 40 UNITS IN NORMAL SALINE INFUSION - SIMPLE MED
1.0000 m[IU]/min | INTRAVENOUS | Status: DC
  Administered 2020-01-29: 2 m[IU]/min via INTRAVENOUS

## 2020-01-28 MED ORDER — OXYCODONE-ACETAMINOPHEN 5-325 MG PO TABS: 2.0000 | ORAL_TABLET | ORAL | Status: DC | PRN

## 2020-01-28 MED ORDER — ACETAMINOPHEN 325 MG PO TABS: 650.0000 mg | ORAL_TABLET | ORAL | Status: DC | PRN

## 2020-01-28 MED ORDER — OXYCODONE-ACETAMINOPHEN 5-325 MG PO TABS: 1.0000 | ORAL_TABLET | ORAL | Status: DC | PRN

## 2020-01-28 MED ORDER — FENTANYL CITRATE (PF) 100 MCG/2ML IJ SOLN
100.0000 ug | INTRAMUSCULAR | Status: DC | PRN
  Administered 2020-01-28 – 2020-01-29 (×2): 100 ug via INTRAVENOUS
  Filled 2020-01-28 (×2): qty 2

## 2020-01-28 MED ORDER — FENTANYL CITRATE (PF) 100 MCG/2ML IJ SOLN
INTRAMUSCULAR | Status: AC
  Administered 2020-01-28: 100 ug via INTRAVENOUS
  Filled 2020-01-28: qty 2

## 2020-01-28 MED ORDER — OXYTOCIN BOLUS FROM INFUSION
500.0000 mL | Freq: Once | INTRAVENOUS | Status: AC
  Administered 2020-01-29: 500 mL via INTRAVENOUS

## 2020-01-28 MED ORDER — LACTATED RINGERS IV SOLN
500.0000 mL | INTRAVENOUS | Status: DC | PRN
  Administered 2020-01-29: 500 mL via INTRAVENOUS

## 2020-01-28 MED ORDER — TERBUTALINE SULFATE 1 MG/ML IJ SOLN: 0.2500 mg | Freq: Once | INTRAMUSCULAR | Status: DC | PRN

## 2020-01-28 NOTE — H&P (Addendum)
OBSTETRIC ADMISSION HISTORY AND PHYSICAL  Caitlin Grant is a 24 y.o. female G5P0040 with IUP at [redacted]w[redacted]d by 18 wk [place] presenting for PROM. She reports +FMs, positive LOF, bloody show, no blurry vision, headaches or peripheral edema, and RUQ pain.  She plans on bottle feeding. She is unsure what she wants for do for birth control. She received her prenatal care at  in [facility]    Dating: By 18wk LMP and 18 wk [place] --->  Estimated Date of Delivery: 02/03/20  Sono:  @36w , CWD, normal anatomy, cephalic presentation, vertex lie, 31% EFW 5lb 15oz   Prenatal History/Complications: ECIF noted on anatomy scan and resolved at 36 week scan. 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   Past Medical History: Past Medical History:  Diagnosis Date   Depression    Ectopic pregnancy     Past Surgical History: Past Surgical History:  Procedure Laterality Date   TONSILLECTOMY     WISDOM TOOTH EXTRACTION      Obstetrical History: OB History     Gravida  5   Para      Term      Preterm      AB  4   Living         SAB  4   TAB      Ectopic      Multiple      Live Births              Social History: Social History   Socioeconomic History   Marital status: Single    Spouse name: Not on file   Number of children: Not on file   Years of education: Not on file   Highest education level: Not on file  Occupational History   Not on file  Tobacco Use   Smoking status: Former Smoker    Packs/day: 0.50    Types: Cigarettes   Smokeless tobacco: Never Used   Tobacco comment: not while pregnant  Substance and Sexual Activity   Alcohol use: Not Currently    Comment: occ   Drug use: Not Currently    Types: Marijuana    Comment: March 2020   Sexual activity: Yes  Other Topics Concern   Not on file  Social History Narrative   Not on file   Social Determinants of Health   Financial Resource Strain:    Difficulty of Paying Living Expenses: Not on file  Food Insecurity:    Worried About  Running Out of Food in the Last Year: Not on file   April 2020 of Food in the Last Year: Not on file  Transportation Needs:    Lack of Transportation (Medical): Not on file   Lack of Transportation (Non-Medical): Not on file  Physical Activity:    Days of Exercise per Week: Not on file   Minutes of Exercise per Session: Not on file  Stress:    Feeling of Stress : Not on file  Social Connections:    Frequency of Communication with Friends and Family: Not on file   Frequency of Social Gatherings with Friends and Family: Not on file   Attends Religious Services: Not on file   Active Member of Clubs or Organizations: Not on file   Attends [employer] Meetings: Not on file   Marital Status: Not on file    Family History: Family History  Problem Relation Age of Onset   Healthy Mother    Healthy Father     Allergies: Allergies  Allergen Reactions   Penicillins Shortness Of Breath and Rash    Did it involve swelling of the face/tongue/throat, SOB, or low BP? Yes Did it involve sudden or severe rash/hives, skin peeling, or any reaction on the inside of your mouth or nose? Unk Did you need to seek medical attention at a hospital or doctor's office? Yes When did it last happen? "I was a baby" If all above answers are "NO", may proceed with cephalosporin use.    Latex Rash    Rash where latex makes contact    Medications Prior to Admission  Medication Sig Dispense Refill Last Dose   Prenatal Vit-Fe Fumarate-FA (PRENATAL VITAMINS PO) Take by mouth.        Review of Systems   All systems reviewed and negative except as stated in HPI  Blood pressure 112/80, pulse 92, temperature 98.8 F (37.1 C), temperature source Oral, resp. rate 16, last menstrual period 05/05/2019, SpO2 98 %. General appearance: alert, cooperative and appears stated age Lungs: clear to auscultation bilaterally Heart: regular rate and rhythm Abdomen: soft, non-tender; bowel sounds normal Pelvic: no  apparent lesions, over external genitalia or vaginal mucosa. Extremities: Homans sign is negative, no sign of DVT Presentation: cephalic Fetal monitoringBaseline: 165 bpm, Variability: Good {> 6 bpm) and Accelerations: Reactive Uterine activityFrequency: Every 5 minutes Dilation: 1.5 Effacement (%): 50 Station: -2 Exam by:: hashem rn   Prenatal labs: ABO, Rh:  A+ Antibody:  Neg Rubella:   Positive RPR:   NR HBsAg:   NR HIV:   neg GBS:   neg 1 hr Glucola negative Genetic screening  normal Anatomy US normal except cardiac fucus  Prenatal Transfer Tool  Maternal Diabetes: No Genetic Screening: Normal Maternal Ultrasounds/Referrals: Normal Fetal Ultrasounds or other Referrals:  None Maternal Substance Abuse:  No Significant Maternal Medications:  Meds include: Other: valtrex Significant Maternal Lab Results: None  No results found for this or any previous visit (from the past 24 hour(s)).  Patient Active Problem List   Diagnosis Date Noted   Premature rupture of membranes 01/28/2020    Assessment/Plan:  Caitlin Grant is a 24 y.o. [id number] at [redacted]w[redacted]d here for PROM  #Labor: Plan to manage expectantly for now and consider augmentation if insignificant progress at next cervical check. #Pain: PRN fentanyl for now. Epidural upon request #FWB: Category I #ID:  GBS neg #MOF: Bottle #MOC:unsure #Circ:  Yes (depending on payment options) #Hx of HSV: Last outbreak in 08/2019. No lesions present on exam. Taking valtrex. (SO does not know about HSV)  Caitlin Haymaker, MD  01/28/2020, 4:10 PM   I confirm that I have verified the information documented in the resident's note and that I have also personally reperformed the history, physical exam and all medical decision making activities of this service and have verified that all service and findings are accurately documented in this student's note.   Caitlin Grant, [place] 01/28/2020 7:01 PM

## 2020-01-28 NOTE — Progress Notes (Signed)
Labor Progress Note Ariabella Brien is a 24 y.o. C6O8241 at [redacted]w[redacted]d presented for SROM/SOL. S: Patient bouncing on ball. Feeling ctx every 5-7 min. Some moderate and some mild. Discussed plan.   O:  BP 131/82   Pulse 80   Temp 98.4 F (36.9 C) (Oral)   Resp 16   Ht 5\' 8"  (1.727 m)   Wt 93.5 kg   LMP 05/05/2019   SpO2 98%   BMI 31.35 kg/m  EFM: 145, moderate variability, pos accels, no decels, reactive TOCO: q5-73m  CVE: Dilation: 3.5 Effacement (%): 80 Station: -2 Presentation: Vertex Exam by:: Dr [email or web address]   A&P: 24 y.o. 24 [redacted]w[redacted]d here for SOL/possible SROM. #Labor: Progressing well. Good cervical change since first exam. Also felt bulging bag of water and patient has not been leaking since being on L&D; low suspicion for rupture. Patient decided to start low dose Pit. AROM when indicated. Anticipate SVD. #Pain: per patient request; desires no epidural if possible #FWB: Cat I #GBS negative  [redacted]w[redacted]d, MD 9:25 PM

## 2020-01-28 NOTE — MAU Note (Signed)
Caitlin Grant is a 24 y.o. at [redacted]w[redacted]d here in MAU reporting: pt arrived via EMS reporting contractions since this morning, started to be unbearable within the past 2 hours. States her mucus plug came out. +FM. ? LOF  Onset of complaint: today  Pain score: 7/10  Vitals:   01/28/20 1552  BP: 112/80  Pulse: 92  Resp: 16  Temp: 98.8 F (37.1 C)  SpO2: 98%     FHT: +FM  Lab orders placed from triage: none

## 2020-01-29 ENCOUNTER — Encounter (HOSPITAL_COMMUNITY): Payer: Self-pay | Admitting: Obstetrics and Gynecology

## 2020-01-29 ENCOUNTER — Inpatient Hospital Stay (HOSPITAL_COMMUNITY): Payer: Medicaid Other | Admitting: Anesthesiology

## 2020-01-29 DIAGNOSIS — F329 Major depressive disorder, single episode, unspecified: Secondary | ICD-10-CM

## 2020-01-29 DIAGNOSIS — Z8619 Personal history of other infectious and parasitic diseases: Secondary | ICD-10-CM

## 2020-01-29 DIAGNOSIS — F32A Depression, unspecified: Secondary | ICD-10-CM

## 2020-01-29 LAB — RPR: RPR Ser Ql: NONREACTIVE

## 2020-01-29 MED ORDER — LIDOCAINE HCL (PF) 1 % IJ SOLN
INTRAMUSCULAR | Status: DC | PRN
  Administered 2020-01-29: 5 mL via EPIDURAL

## 2020-01-29 MED ORDER — TETANUS-DIPHTH-ACELL PERTUSSIS 5-2.5-18.5 LF-MCG/0.5 IM SUSP: 0.5000 mL | Freq: Once | INTRAMUSCULAR | Status: DC

## 2020-01-29 MED ORDER — ACETAMINOPHEN 325 MG PO TABS
650.0000 mg | ORAL_TABLET | Freq: Four times a day (QID) | ORAL | Status: DC | PRN
  Administered 2020-01-29 – 2020-01-31 (×5): 650 mg via ORAL
  Filled 2020-01-29 (×5): qty 2

## 2020-01-29 MED ORDER — EPHEDRINE 5 MG/ML INJ: 10.0000 mg | INTRAVENOUS | Status: DC | PRN

## 2020-01-29 MED ORDER — SODIUM CHLORIDE (PF) 0.9 % IJ SOLN
INTRAMUSCULAR | Status: DC | PRN
  Administered 2020-01-29: 12 mL/h via EPIDURAL

## 2020-01-29 MED ORDER — ONDANSETRON HCL 4 MG PO TABS: 4.0000 mg | ORAL_TABLET | ORAL | Status: DC | PRN

## 2020-01-29 MED ORDER — SERTRALINE HCL 50 MG PO TABS
50.0000 mg | ORAL_TABLET | Freq: Every day | ORAL | Status: DC
  Administered 2020-01-29 – 2020-01-31 (×3): 50 mg via ORAL
  Filled 2020-01-29 (×3): qty 1

## 2020-01-29 MED ORDER — PRENATAL MULTIVITAMIN CH
1.0000 | ORAL_TABLET | Freq: Every day | ORAL | Status: DC
  Administered 2020-01-30 – 2020-01-31 (×2): 1 via ORAL
  Filled 2020-01-29 (×2): qty 1

## 2020-01-29 MED ORDER — COCONUT OIL OIL
1.0000 "application " | TOPICAL_OIL | Status: DC | PRN
  Administered 2020-01-31: 1 via TOPICAL

## 2020-01-29 MED ORDER — SIMETHICONE 80 MG PO CHEW: 80.0000 mg | CHEWABLE_TABLET | ORAL | Status: DC | PRN

## 2020-01-29 MED ORDER — DIPHENHYDRAMINE HCL 25 MG PO CAPS: 25.0000 mg | ORAL_CAPSULE | Freq: Four times a day (QID) | ORAL | Status: DC | PRN

## 2020-01-29 MED ORDER — BENZOCAINE-MENTHOL 20-0.5 % EX AERO
1.0000 "application " | INHALATION_SPRAY | CUTANEOUS | Status: DC | PRN
  Administered 2020-01-30: 1 via TOPICAL
  Filled 2020-01-29 (×2): qty 56

## 2020-01-29 MED ORDER — SENNOSIDES-DOCUSATE SODIUM 8.6-50 MG PO TABS
2.0000 | ORAL_TABLET | ORAL | Status: DC
  Administered 2020-01-30 (×2): 2 via ORAL
  Filled 2020-01-29 (×2): qty 2

## 2020-01-29 MED ORDER — PHENYLEPHRINE 40 MCG/ML (10ML) SYRINGE FOR IV PUSH (FOR BLOOD PRESSURE SUPPORT): 80.0000 ug | PREFILLED_SYRINGE | INTRAVENOUS | Status: DC | PRN

## 2020-01-29 MED ORDER — DIPHENHYDRAMINE HCL 50 MG/ML IJ SOLN: 12.5000 mg | INTRAMUSCULAR | Status: DC | PRN

## 2020-01-29 MED ORDER — ONDANSETRON HCL 4 MG/2ML IJ SOLN: 4.0000 mg | INTRAMUSCULAR | Status: DC | PRN

## 2020-01-29 MED ORDER — LACTATED RINGERS IV SOLN: 500.0000 mL | Freq: Once | INTRAVENOUS | Status: DC

## 2020-01-29 MED ORDER — FENTANYL-BUPIVACAINE-NACL 0.5-0.125-0.9 MG/250ML-% EP SOLN
12.0000 mL/h | EPIDURAL | Status: DC | PRN
  Filled 2020-01-29: qty 250

## 2020-01-29 MED ORDER — IBUPROFEN 600 MG PO TABS
600.0000 mg | ORAL_TABLET | Freq: Three times a day (TID) | ORAL | Status: DC | PRN
  Administered 2020-01-29 – 2020-01-31 (×4): 600 mg via ORAL
  Filled 2020-01-29 (×4): qty 1

## 2020-01-29 MED ORDER — MEASLES, MUMPS & RUBELLA VAC IJ SOLR: 0.5000 mL | Freq: Once | INTRAMUSCULAR | Status: DC

## 2020-01-29 MED ORDER — WITCH HAZEL-GLYCERIN EX PADS: 1.0000 "application " | MEDICATED_PAD | CUTANEOUS | Status: DC | PRN

## 2020-01-29 MED ORDER — DIBUCAINE (PERIANAL) 1 % EX OINT: 1.0000 "application " | TOPICAL_OINTMENT | CUTANEOUS | Status: DC | PRN

## 2020-01-29 NOTE — Progress Notes (Signed)
CSW acknowledged consult and attempted to meet with MOB. However, bedside nurse was attending to MOB.  CSW will meet with MOB at a later time.  Marceline Napierala D. Remijio Holleran, MSW, LCSWA Clinical Social Work [phone] 

## 2020-01-29 NOTE — Discharge Summary (Signed)
Postpartum Discharge Summary     Patient Name: Caitlin Grant DOB: Jul 05, 1996 MRN: [id number]  Date of admission: 01/28/2020 Delivering Provider: Chauncey Mann   Date of discharge: 01/31/2020  Admitting diagnosis: Premature rupture of membranes [O42.90] Intrauterine pregnancy: [redacted]w[redacted]d    Secondary diagnosis:  Principal Problem:   SVD (spontaneous vaginal delivery) Active Problems:   Premature rupture of membranes   Labor and delivery indication for care or intervention   History of herpes genitalis   Depression   Obstetrical laceration  Additional problems: None     Discharge diagnosis: Term Pregnancy Delivered                                                                                                Post partum procedures:None  Augmentation: AROM and Pitocin  Complications: None  Hospital course:  Onset of Labor With Vaginal Delivery     24y.o. yo G5P0040 at 39w2das admitted in Latent Labor on 01/28/2020. Patient had an uncomplicated labor course as follows: Initial SVE: 1.5/50/-2. Patient received Pitocin, AROM and epidural. She then progressed to complete.  Membrane Rupture Time/Date: 12:00 PM ,01/28/2020   Intrapartum Procedures: Episiotomy: None [1]                                         Lacerations:  Periurethral [8]  Patient had a delivery of a Viable infant. 01/29/2020  Information for the patient's newborn:  BuChabeli, Barsamian0[[id number]]Delivery Method: Vaginal, Spontaneous(Filed from Delivery Summary)     Pateint had an uncomplicated postpartum course.  She is ambulating, tolerating a regular diet, passing flatus, and urinating well. Patient is discharged home in stable condition on 01/31/20.  Delivery time: 5:04 AM    Magnesium Sulfate received: No BMZ received: No Rhophylac:No MMR:No Transfusion:No  Physical exam  Vitals:   01/30/20 0540 01/30/20 1314 01/30/20 2134 01/31/20 0522  BP: 126/85 127/90 124/83 114/80  Pulse: 86 87 85 99   Resp: '16 18 19   ' Temp: 98.5 F (36.9 C) 98.3 F (36.8 C) 98.8 F (37.1 C) 98.2 F (36.8 C)  TempSrc: Oral Oral Oral Oral  SpO2: 100%  100%   Weight:      Height:       General: alert, cooperative and no distress  Chest: Lungs CTA, Heart RRR Abdomen: Soft, Appropriately Tender, BS x 4Q Lochia: appropriate Uterine Fundus: firm at U/-1 Incision: N/A DVT Evaluation: No significant calf/ankle edema. Labs: Lab Results  Component Value Date   WBC 8.4 01/28/2020   HGB 14.0 01/28/2020   HCT 38.1 01/28/2020   MCV 80.4 01/28/2020   PLT 247 01/28/2020   CMP Latest Ref Rng & Units 08/26/2018  Glucose 70 - 99 mg/dL 121(H)  BUN 6 - 20 mg/dL 9  Creatinine 0.44 - 1.00 mg/dL 0.83  Sodium 135 - 145 mmol/L 140  Potassium 3.5 - 5.1 mmol/L 3.8  Chloride 98 - 111 mmol/L 106  CO2 22 - 32 mmol/L 25  Calcium 8.9 -  10.3 mg/dL 8.9  Total Protein 6.5 - 8.1 g/dL 6.5  Total Bilirubin 0.3 - 1.2 mg/dL 0.8  Alkaline Phos 38 - 126 U/L 44  AST 15 - 41 U/L 22  ALT 0 - 44 U/L 13   Edinburgh Score: Edinburgh Postnatal Depression Scale Screening Tool 01/29/2020  I have been able to laugh and see the funny side of things. (No Data)    Discharge instruction: per After Visit Summary and "Baby and Me Booklet". Pain Management, Peri-Care, Breastfeeding, Who and When to call for postpartum complications. Information Sheet(s) given Care after Vaginal Delivery  After visit meds:  Allergies as of 01/31/2020      Reactions   Penicillins Shortness Of Breath, Rash   Did it involve swelling of the face/tongue/throat, SOB, or low BP? Yes Did it involve sudden or severe rash/hives, skin peeling, or any reaction on the inside of your mouth or nose? Unk Did you need to seek medical attention at a hospital or doctor's office? Yes When did it last happen? "I was a baby" If all above answers are "NO", may proceed with cephalosporin use.   Latex Rash   Rash where latex makes contact      Medication List    STOP  taking these medications   Biotin 1 MG Caps   omeprazole 20 MG capsule Commonly known as: PRILOSEC     TAKE these medications   ibuprofen 600 MG tablet Commonly known as: ADVIL Take 1 tablet (600 mg total) by mouth every 8 (eight) hours as needed for mild pain.   loratadine 10 MG tablet Commonly known as: CLARITIN Take 10 mg by mouth at bedtime.   norethindrone 0.35 MG tablet Commonly known as: Ortho Micronor Take 1 tablet (0.35 mg total) by mouth daily.   prenatal multivitamin Tabs tablet Take 1 tablet by mouth at bedtime.   sertraline 50 MG tablet Commonly known as: ZOLOFT Take 50 mg by mouth daily.   valACYclovir 500 MG tablet Commonly known as: VALTREX Take 500 mg by mouth daily.       Diet: routine diet  Activity: Advance as tolerated. Pelvic rest for 6 weeks.   Outpatient follow up:4 weeks Follow up Appt:No future appointments. Follow up Visit:    Please schedule this patient for Postpartum visit in: 4 weeks with the following provider: Any provider Virtual For C/S patients schedule nurse incision check in weeks 2 weeks: no Low risk pregnancy complicated by: none Delivery mode:  SVD Anticipated Birth Control:  other/unsure PP Procedures needed: none  Schedule Integrated Seltzer visit: no     Newborn Data: Live born female-Ahmil Birth Weight: 7lbs 2oz  APGAR: 36, 9  Newborn Delivery   Birth date/time: 01/29/2020 05:04:00 Delivery type: Vaginal, Spontaneous      Baby Feeding: Bottle Disposition:home with mother   01/31/2020 Maryann Conners, CNM

## 2020-01-29 NOTE — Anesthesia Procedure Notes (Signed)
Epidural Patient location during procedure: OB Start time: 01/29/2020 12:52 AM End time: 01/29/2020 1:09 AM  Staffing Anesthesiologist: Trevor Iha, MD Performed: anesthesiologist   Preanesthetic Checklist Completed: patient identified, IV checked, site marked, risks and benefits discussed, surgical consent, monitors and equipment checked, pre-op evaluation and timeout performed  Epidural Patient position: sitting Prep: DuraPrep and site prepped and draped Patient monitoring: continuous pulse ox and blood pressure Approach: midline Location: L3-L4 Injection technique: LOR air  Needle:  Needle type: Tuohy  Needle gauge: 17 G Needle length: 9 cm and 9 Needle insertion depth: 7 cm Catheter type: closed end flexible Catheter size: 19 Gauge Catheter at skin depth: 12 cm Test dose: negative  Assessment Events: blood not aspirated, injection not painful, no injection resistance, no paresthesia and negative IV test  Additional Notes Patient identified. Risks/Benefits/Options discussed with patient including but not limited to bleeding, infection, nerve damage, paralysis, failed block, incomplete pain control, headache, blood pressure changes, nausea, vomiting, reactions to medication both or allergic, itching and postpartum back pain. Confirmed with bedside nurse the patient's most recent platelet count. Confirmed with patient that they are not currently taking any anticoagulation, have any bleeding history or any family history of bleeding disorders. Patient expressed understanding and wished to proceed. All questions were answered. Sterile technique was used throughout the entire procedure. Please see nursing notes for vital signs. Test dose was given through epidural needle and negative prior to continuing to dose epidural or start infusion. Warning signs of high block given to the patient including shortness of breath, tingling/numbness in hands, complete motor block, or any  concerning symptoms with instructions to call for help. Patient was given instructions on fall risk and not to get out of bed. All questions and concerns addressed with instructions to call with any issues. 1 Attempt (S) . Patient tolerated procedure well.

## 2020-01-29 NOTE — Progress Notes (Signed)
Labor Progress Note Caitlin Grant is a 24 y.o. [id number] at 102w2d presented for SROM/SOL. S: Decided not to start Pitocin. Feeling ctx.   O:  BP 122/70   Pulse 74   Temp 98.3 F (36.8 C) (Oral)   Resp 16   Ht 5\' 8"  (1.727 m)   Wt 93.5 kg   LMP 05/05/2019   SpO2 98%   BMI 31.35 kg/m  EFM: 145, moderate variability, pos accels, no decels, reactive TOCO: q3-87m  CVE: Dilation: 4 Effacement (%): 80 Station: -2 Presentation: Vertex Exam by:: Bre Price RN   A&P: 24 y.o. 25 [redacted]w[redacted]d here for SOL/possible SROM. #Labor: Progressing well. Continues to make change. Likely not ruptured. Decided not to start Pitocin yet. AROM when indicated. Anticipate SVD. #Pain: per patient request; desires no epidural if possible #FWB: Cat I #GBS negative  [redacted]w[redacted]d, MD 1:16 AM

## 2020-01-29 NOTE — Anesthesia Preprocedure Evaluation (Signed)
Anesthesia Evaluation  Patient identified by MRN, date of birth, ID band Patient awake    Reviewed: Allergy & Precautions, NPO status , Patient's Chart, lab work & pertinent test results  Airway Mallampati: II  TM Distance: >3 FB Neck ROM: Full    Dental no notable dental hx. (+) Teeth Intact   Pulmonary former smoker,    Pulmonary exam normal breath sounds clear to auscultation       Cardiovascular negative cardio ROS Normal cardiovascular exam Rhythm:Regular Rate:Normal     Neuro/Psych negative neurological ROS  negative psych ROS   GI/Hepatic negative GI ROS, Neg liver ROS,   Endo/Other  negative endocrine ROS  Renal/GU negative Renal ROS     Musculoskeletal negative musculoskeletal ROS (+)   Abdominal   Peds  Hematology Hgb 14.0 Plt 247   Anesthesia Other Findings All: latex.PCn  Reproductive/Obstetrics (+) Pregnancy                             Anesthesia Physical Anesthesia Plan  ASA: II  Anesthesia Plan: Epidural   Post-op Pain Management:    Induction:   PONV Risk Score and Plan:   Airway Management Planned:   Additional Equipment:   Intra-op Plan:   Post-operative Plan:   Informed Consent: I have reviewed the patients History and Physical, chart, labs and discussed the procedure including the risks, benefits and alternatives for the proposed anesthesia with the patient or authorized representative who has indicated his/her understanding and acceptance.       Plan Discussed with:   Anesthesia Plan Comments: (39 2/7 Wk G5P0 for LEA  )        Anesthesia Quick Evaluation

## 2020-01-29 NOTE — Lactation Note (Signed)
This note was copied from a baby's chart. Lactation Consultation Note  Patient Name: Caitlin Grant YJEHU'D Date: 01/29/2020   P1, 18 hour female infant. LC entered the room mom and infant asleep.   Maternal Data    Feeding    LATCH Score                   Interventions    Lactation Tools Discussed/Used     Consult Status      Danelle Earthly 01/29/2020, 11:52 PM

## 2020-01-29 NOTE — Anesthesia Postprocedure Evaluation (Signed)
Anesthesia Post Note  Patient: Caitlin Grant  Procedure(s) Performed: AN AD HOC LABOR EPIDURAL     Patient location during evaluation: Mother Baby Anesthesia Type: Epidural Level of consciousness: awake and alert Pain management: pain level controlled Vital Signs Assessment: post-procedure vital signs reviewed and stable Respiratory status: spontaneous breathing, nonlabored ventilation and respiratory function stable Cardiovascular status: stable Postop Assessment: no headache, no backache and epidural receding Anesthetic complications: no    Last Vitals:  Vitals:   01/29/20 0830 01/29/20 0930  BP: 127/78 119/81  Pulse: 86 87  Resp:    Temp: 37.1 C 36.6 C  SpO2: 100% 98%    Last Pain:  Vitals:   01/29/20 1025  TempSrc:   PainSc: 0-No pain   Pain Goal: Patients Stated Pain Goal: 4 (01/28/20 1737)                 Minerva Areola

## 2020-01-29 NOTE — Progress Notes (Signed)
CSW acknowledges consult.  CSW attempted to meet with MOB, however MOB was asleep on arrival.  CSW will attempt to visit with MOB at a later time.   Kaliya Shreiner D. Jowell Bossi, MSW, LCSWA Clinical Social Worker [phone] 

## 2020-01-30 LAB — GC/CHLAMYDIA PROBE AMP (~~LOC~~) NOT AT ARMC
Chlamydia: NEGATIVE
Comment: NEGATIVE
Comment: NORMAL
Neisseria Gonorrhea: NEGATIVE

## 2020-01-30 NOTE — Progress Notes (Signed)
Post Partum Day 1 Subjective: Patient reports feeling well. She is tolerating PO. Ambulating and urinating without difficulty. Lochia minimal.   Objective: Blood pressure (!) 132/96, pulse 80, temperature 98.6 F (37 C), temperature source Oral, resp. rate 17, height 5\' 8"  (1.727 m), weight 93.5 kg, last menstrual period 05/05/2019, SpO2 100 %, unknown if currently breastfeeding.  Physical Exam:  General: alert, cooperative, appears stated age and no distress Lochia: appropriate Uterine Fundus: firm Incision: NA DVT Evaluation: No evidence of DVT seen on physical exam.  Recent Labs    01/28/20 1939  HGB 14.0  HCT 38.1    Assessment/Plan: Plan for discharge tomorrow; if baby can discharge today, RN to page team for discharge orders Declines birth control Vitals stable; AM BP with mild-range diastolic but otherwise WNL; cont to monitor  SW consult pending (patient lives in a group home and has transportation issues) Formula feeding   LOS: 2 days   01/30/20 01/30/2020, 5:46 AM

## 2020-01-30 NOTE — Clinical Social Work Maternal (Signed)
CLINICAL SOCIAL WORK MATERNAL/CHILD NOTE  Patient Details  Name: Caitlin Grant MRN: [id number] Date of Birth: [birth year]  Date:  01/30/2020  Clinical Social Worker Initiating Note:  Elijio Miles Date/Time: Initiated:  01/30/20/0902     Child's Name:  Caitlin Grant   Biological Parents:  Mother, Father(Lowana Hillmann and Ria Bush DOB: 06/15/1999)   Need for Interpreter:  None   Reason for Referral:  Behavioral Health Concerns, Current Substance Use/Substance Use During Pregnancy , Other (Comment)(Living in [facility] with transporation concerns)   Address:  [place] [address]    Phone number:  [phone] (home)     Additional phone number:   Household Members/Support Persons (HM/SP):       HM/SP Name Relationship DOB or Age  HM/SP -1        HM/SP -2        HM/SP -3        HM/SP -4        HM/SP -5        HM/SP -6        HM/SP -7        HM/SP -8          Natural Supports (not living in the home):  Spouse/significant other, Extended Family   Professional Supports: Therapist(Therapist through [facility] [place])   Employment: Animator   Type of Work: [employer]   Education:  Other (comment)(In the process of obtaining GED)   Homebound arranged:    Museum/gallery curator Resources:  Medicaid   Other Resources:  WIC(Intends to apply for food stamps)   Cultural/Religious Considerations Which May Impact Care:    Strengths:  Ability to meet basic needs , Home prepared for child    Psychotropic Medications:         Pediatrician:       Pediatrician List:   [place]      Pediatrician Fax Number:    Risk Factors/Current Problems:  Mental Health Concerns , Substance Use , Transportation    Cognitive State:  Able to Concentrate , Alert , Linear Thinking , Insightful    Mood/Affect:  Calm , Comfortable , Bright ,  Interested , Happy , Relaxed    CSW Assessment:  CSW received consult for history of anxiety and depression, substance use during pregnancy and living in group home needing assistance with transportation.  CSW met with MOB to offer support and complete assessment.  MOB sitting up in bed tending to infant with FOB present at bedside, when CSW entered the room. CSW introduced self and received verbal permission from MOB to have FOB step out of the room while CSW completed assessment. FOB understanding and left voluntarily. MOB very pleasant and easy to engage throughout assessment and appeared well-bonded and attentive to infant during visit. MOB reported she currently lives at [facility] and has been for the last 6 months. MOB reported she signed for an apartment on Friday and is hopeful to move in the next week. MOB stated new address is [address] [place], [place]. MOB unsure of exact apartment but thinks it's [address]. MOB reported it will just be her and infant living in the apartment with FOB staying over a couple of nights of the week. MOB reported she is employed full-time through [employer] and is working on obtaining her  GED. MOB confirmed she receives [facility] and intends to apply for food stamps.   CSW inquired about MOB's mental health history and MOB openly shared a history of anxiety and depression with an increase in symptoms around November. MOB stated she was started on Zoloft and has felt much better. MOB reported increase in symptoms could be attributed to the pregnancy not being expected and living in the maternity home during [place] and being quarantined with the other women. MOB stated she feels she deals with anxiety more than depression but utilizes coping skills often to help manage symptoms. MOB reported she also sees a therapist once a month through [place] but expressed a desire in starting to see a therapist more often but her current therapist  does not have enough time. CSW provided MOB with list of counseling resources. MOB aware of PPD/A through classes she took during pregnancy but was open to CSW reviewing education. CSW provided education regarding the baby blues period vs. perinatal mood disorders, discussed treatment and gave resources for mental health follow up if concerns arise. CSW recommended self-evaluation during the postpartum time period using the New Mom Checklist from Postpartum Progress and encouraged MOB to contact a medical professional if symptoms are noted at any time. MOB did not appear to be displaying any acute mental health symptoms and denied any current SI, HI or DV. MOB reported having good support from FOB and her aunt. MOB confirmed having all essential items for infant once discharged and stated infant would be sleeping in a bassinet once home. CSW provided review of Sudden Infant Death Syndrome (SIDS) precautions and safe sleeping habits.    CSW inquired about MOB's substance use history and MOB reported it was recreational use and nothing she felt was an issue. Per MOB, she has "let it all go". CSW inquired about noted marijuana and ecstasy use during ED visit in November to which MOB denied. Per MOB, she did not use any substances after finding out she was pregnant. CSW informed MOB of Hospital Drug Policy and explained UDS came back negative but that CDS was still pending and a CPS report would be made, if warranted. MOB denied any questions or concerns regarding policy. MOB brought up concerns for transportation once discharged. Per MOB, she and FOB are having to use public transportation but are working on having a car within the next month. CSW to provide MOB with Medicaid Transportation information so that MOB can get set up. CSW also to follow up with MOB regarding chosen Pediatrician as they may have resources. CSW will also be making [place] and [facility] referrals to offer additional support after discharge.    CSW to continue to monitor CDS results and make CPS report, if needed.    CSW Plan/Description:  No Further Intervention Required/No Barriers to Discharge, Sudden Infant Death Syndrome (SIDS) Education, Perinatal Mood and Anxiety Disorder (PMADs) Education, [place], Other Information/Referral to [employer], CSW Will Continue to Monitor Umbilical Cord Tissue Drug Screen Results and Make Report if Foye Spurling, [place] 08-Dec-2020, 9:51 AM

## 2020-01-30 NOTE — Lactation Note (Signed)
This note was copied from a baby's chart. Lactation Consultation Note  Patient Name: Caitlin Grant IRWER'X Date: 01/30/2020 Reason for consult: Initial assessment;1st time breastfeeding P1, 20 hour term female infant. Infant had 5 voids and 2 stools since birth. Mom is breast and bottle feeding infant her choice. She was given a harmony hand pump by RN  and has been pre-pumping her breast prior to latching infant LC notice mom's nipples are everted and doesn't need breast shells. Working on latching infant on right breast, mom feels infant latching well on her left breast. LC did nor observe a latch at this time due infant breastfeeding for 25 minutes at 10:30 pm.  Mom plans to start breastfeeding infant at every feeding to help establish milk supply and then supplement with formula afterwards if infant continues to cue if needed.  Mom knows to breastfeed according to hunger cues, 8 to 12 times within  24 hours and not exceed 3 hours without breastfeeding infant. Mom Knows to call RN or LC if she needs assistance with latching infant at breast. Mom made aware of O/P services, breastfeeding support groups, community resources, and our phone # for post-discharge questions.      Maternal Data Formula Feeding for Exclusion: Yes Reason for exclusion: Mother's choice to formula and breast feed on admission Has patient been taught Hand Expression?: Yes Does the patient have breastfeeding experience prior to this delivery?: No  Feeding Feeding Type: Breast Fed  LATCH Score                   Interventions Interventions: Breast feeding basics reviewed;Skin to skin;Hand express;Pre-pump if needed;Shells;Hand pump  Lactation Tools Discussed/Used Tools: Shells;Pump WIC Program: No Pump Review: Setup, frequency, and cleaning Initiated by:: by RN Date initiated:: 01/29/20   Consult Status Consult Status: Follow-up Date: 01/30/20 Follow-up type: In-patient    Danelle Earthly 01/30/2020, 1:09 AM

## 2020-01-31 MED ORDER — NORETHINDRONE 0.35 MG PO TABS: 1.0000 | ORAL_TABLET | Freq: Every day | ORAL | 5 refills | Status: DC

## 2020-01-31 MED ORDER — IBUPROFEN 600 MG PO TABS: 600.0000 mg | ORAL_TABLET | Freq: Three times a day (TID) | ORAL | 0 refills | Status: DC | PRN

## 2020-01-31 NOTE — Lactation Note (Signed)
This note was copied from a baby's chart. Lactation Consultation Note Baby 45 hrs old. Mom called for latch assistance.  Changed baby's diaper, voided w/smear of stool. Baby also had curdled milk emesis small amount. Abd. Distended. Assisted in football position. Baby latched well. Continuous education the entire time assisting mom. LC asked mom to teach back. Praised mom. Baby needed latching a couple of times fed well. Chin tug and upper lip flanged. Mom stated felt ok. No pinching but felt tugging. When baby popped off nipple pinched.  Mom re-latched, baby fed w/no swallows heard. LC compress breast at intervals. Baby started getting restless, popping off crying. The baby has nasal congestion. Baby burp, then back on breast, same again. Mom tried burping baby, mom stated she gets nervous when the baby arches back. Baby kept arching, and crying. Mom getting very anxious. Encouraged mom to take deep breaths and calm down. Reminded mom babies cry.   Go through the list of the following: Does the baby need burped? Does the diaper need changed? Is the baby hungry? Is the baby giving feeding cues? Is the baby to hot or to cold? Is the baby tired? Does the baby just want to be held?  Mom cradled the baby placed on the Lt. Breast talked to the baby telling him it was OK and rocked him. LC praised mom.  Mom told LC that she grew up in [place] and she remembers things when she was 24 yrs old and crying. It brings bad memories to her and she didn't want him to cry that it makes her nervous. Mom wants the baby to have a pacifier. LC discussed how it hides feeding cues. Mom stated he likes it and he wants it so she wants him to have it. Mom stated she would make sure he fed first the he could have it going to sleep.  LC encouraged mom to hold baby upright for at least 15 minutes if possible so baby would throw up as much. Explained babies throw up, but don't over feed. LC reviewed again how  much baby should be getting.  Mom stated she wants to pump and bottle feed. LC told mom that I would fax WIC a paper expressing desire to get a DEBP. Mom is to call in am when they open.     Patient Name: Caitlin Grant [id number] Date: 01/31/2020 Reason for consult: Mother's request;Difficult latch;Primapara;Term   Maternal Data Has patient been taught Hand Expression?: Yes Does the patient have breastfeeding experience prior to this delivery?: No  Feeding Feeding Type: Formula Nipple Type: Slow - flow  LATCH Score Latch: Repeated attempts needed to sustain latch, nipple held in mouth throughout feeding, stimulation needed to elicit sucking reflex.  Audible Swallowing: A few with stimulation  Type of Nipple: Everted at rest and after stimulation(short shaft)  Comfort (Breast/Nipple): Filling, red/small blisters or bruises, mild/mod discomfort(Lt. nipple sore)  Hold (Positioning): Full assist, staff holds infant at breast  LATCH Score: 5  Interventions Interventions: Breast feeding basics reviewed;Support pillows;Assisted with latch;Position options;Skin to skin;Expressed milk;Breast massage;Coconut oil;Hand express;Pre-pump if needed;Breast compression;Adjust position;Hand pump  Lactation Tools Discussed/Used Tools: Pump;Shells;Coconut oil Shell Type: Inverted Breast pump type: Manual;Double-Electric Breast Pump WIC Program: Yes Pump Review: Setup, frequency, and cleaning;Milk Storage Initiated by:: Peri Jefferson RN IBCLC Date initiated:: 01/31/20   Consult Status Consult Status: Follow-up Date: 01/31/20 Follow-up type: In-patient    Caitlin Grant, Caitlin Grant 01/31/2020, 3:12 AM

## 2020-01-31 NOTE — Discharge Instructions (Signed)
CPostpartum Care After Vaginal Delivery This sheet gives you information about how to care for yourself from the time you deliver your baby to up to 6-12 weeks after delivery (postpartum period). Your health care provider may also give you more specific instructions. If you have problems or questions, contact your health care provider. Follow these instructions at home: Vaginal bleeding  It is normal to have vaginal bleeding (lochia) after delivery. Wear a sanitary pad for vaginal bleeding and discharge. ? During the first week after delivery, the amount and appearance of lochia is often similar to a menstrual period. ? Over the next few weeks, it will gradually decrease to a dry, yellow-brown discharge. ? For most women, lochia stops completely by 4-6 weeks after delivery. Vaginal bleeding can vary from woman to woman.  Change your sanitary pads frequently. Watch for any changes in your flow, such as: ? A sudden increase in volume. ? A change in color. ? Large blood clots.  If you pass a blood clot from your vagina, save it and call your health care provider to discuss. Do not flush blood clots down the toilet before talking with your health care provider.  Do not use tampons or douches until your health care provider says this is safe.  If you are not breastfeeding, your period should return 6-8 weeks after delivery. If you are feeding your child breast milk only (exclusive breastfeeding), your period may not return until you stop breastfeeding. Perineal care  Keep the area between the vagina and the anus (perineum) clean and dry as told by your health care provider. Use medicated pads and pain-relieving sprays and creams as directed.  If you had a cut in the perineum (episiotomy) or a tear in the vagina, check the area for signs of infection until you are healed. Check for: ? More redness, swelling, or pain. ? Fluid or blood coming from the cut or tear. ? Warmth. ? Pus or a bad  smell.  You may be given a squirt bottle to use instead of wiping to clean the perineum area after you go to the bathroom. As you start healing, you may use the squirt bottle before wiping yourself. Make sure to wipe gently.  To relieve pain caused by an episiotomy, a tear in the vagina, or swollen veins in the anus (hemorrhoids), try taking a warm sitz bath 2-3 times a day. A sitz bath is a warm water bath that is taken while you are sitting down. The water should only come up to your hips and should cover your buttocks. Breast care  Within the first few days after delivery, your breasts may feel heavy, full, and uncomfortable (breast engorgement). Milk may also leak from your breasts. Your health care provider can suggest ways to help relieve the discomfort. Breast engorgement should go away within a few days.  If you are breastfeeding: ? Wear a bra that supports your breasts and fits you well. ? Keep your nipples clean and dry. Apply creams and ointments as told by your health care provider. ? You may need to use breast pads to absorb milk that leaks from your breasts. ? You may have uterine contractions every time you breastfeed for up to several weeks after delivery. Uterine contractions help your uterus return to its normal size. ? If you have any problems with breastfeeding, work with your health care provider or Advertising copywriter.  If you are not breastfeeding: ? Avoid touching your breasts a lot. Doing this can make  your breasts produce more milk. ? Wear a good-fitting bra and use cold packs to help with swelling. ? Do not squeeze out (express) milk. This causes you to make more milk. Intimacy and sexuality  Ask your health care provider when you can engage in sexual activity. This may depend on: ? Your risk of infection. ? How fast you are healing. ? Your comfort and desire to engage in sexual activity.  You are able to get pregnant after delivery, even if you have not had  your period. If desired, talk with your health care provider about methods of birth control (contraception). Medicines  Take over-the-counter and prescription medicines only as told by your health care provider.  If you were prescribed an antibiotic medicine, take it as told by your health care provider. Do not stop taking the antibiotic even if you start to feel better. Activity  Gradually return to your normal activities as told by your health care provider. Ask your health care provider what activities are safe for you.  Rest as much as possible. Try to rest or take a nap while your baby is sleeping. Eating and drinking   Drink enough fluid to keep your urine pale yellow.  Eat high-fiber foods every day. These may help prevent or relieve constipation. High-fiber foods include: ? Whole grain cereals and breads. ? Brown rice. ? Beans. ? Fresh fruits and vegetables.  Do not try to lose weight quickly by cutting back on calories.  Take your prenatal vitamins until your postpartum checkup or until your health care provider tells you it is okay to stop. Lifestyle  Do not use any products that contain nicotine or tobacco, such as cigarettes and e-cigarettes. If you need help quitting, ask your health care provider.  Do not drink alcohol, especially if you are breastfeeding. General instructions  Keep all follow-up visits for you and your baby as told by your health care provider. Most women visit their health care provider for a postpartum checkup within the first 3-6 weeks after delivery. Contact a health care provider if:  You feel unable to cope with the changes that your child brings to your life, and these feelings do not go away.  You feel unusually sad or worried.  Your breasts become red, painful, or hard.  You have a fever.  You have trouble holding urine or keeping urine from leaking.  You have little or no interest in activities you used to enjoy.  You have not  breastfed at all and you have not had a menstrual period for 12 weeks after delivery.  You have stopped breastfeeding and you have not had a menstrual period for 12 weeks after you stopped breastfeeding.  You have questions about caring for yourself or your baby.  You pass a blood clot from your vagina. Get help right away if:  You have chest pain.  You have difficulty breathing.  You have sudden, severe leg pain.  You have severe pain or cramping in your lower abdomen.  You bleed from your vagina so much that you fill more than one sanitary pad in one hour. Bleeding should not be heavier than your heaviest period.  You develop a severe headache.  You faint.  You have blurred vision or spots in your vision.  You have bad-smelling vaginal discharge.  You have thoughts about hurting yourself or your baby. If you ever feel like you may hurt yourself or others, or have thoughts about taking your own life, get help  right away. You can go to the nearest emergency department or call:  Your local emergency services (911 in the [place]  A suicide crisis helpline, such as the [facility] at 4024424612. This is open 24 hours a day. Summary  The period of time right after you deliver your newborn up to 6-12 weeks after delivery is called the postpartum period.  Gradually return to your normal activities as told by your health care provider.  Keep all follow-up visits for you and your baby as told by your health care provider. This information is not intended to replace advice given to you by your health care provider. Make sure you discuss any questions you have with your health care provider. Document Revised: 12/04/2017 Document Reviewed: 09/14/2017 Elsevier Patient Education  2020 [employer].

## 2020-01-31 NOTE — Lactation Note (Signed)
This note was copied from a baby's chart. Lactation Consultation Note Baby 84 hrs old. Mom is worried baby isn't getting enough from breast so she is giving formula. Problem is she is giving to much to often. LC spoke w/mom giving her supplementation information on how much after BF according to hrs of age and how much to give if only formula feeding according to hrs of age. Mom stated "Oh wow, I have been giving him as much as if only formula feeding and I have been BF him to". Mom asked if that is why the baby is spitting up. LC discussed newborn feeding habits and behavior. Explained babies spit up but even more if they have been over fed. Can cause his tummy to hurt. Mom stated OK she wouldn't do that anymore. She just didn't want him to be hungry. Mom isn't BF to Rt. Breast because she didn't think he was getting as much so she has just been BF to Lt. Breast. Hand expressed Rt. Breast. Mom has colostrum. Mom excited. Lt. Breast colostrum is thinner, but then baby has been feeding on that breast.  Mom shown how to use DEBP & how to disassemble, clean, & reassemble parts. Mom knows to pump q3h for 15-20 min. Started mom pumping. Mom encouraged to waken baby for feeding if hasn't cued in 3 hrs. Mom encouraged to feed baby 8-12 times/24 hours and with feeding cues.  Baby can go to the breast as often as wishes but only supplement every 3 hrs not w/every feeding. Mom stated she didn't know, she was giving formula after every feeding. Worked w/mom on her hand expression. Mom excited. Encouraged mom to call for next feeding for LC to assist. Gave mom coconut oil for pumping.  Patient Name: Caitlin Grant ASTMH'D Date: 01/31/2020 Reason for consult: Mother's request;Primapara;Term   Maternal Data    Feeding Feeding Type: Breast Milk with Formula added Nipple Type: Slow - flow  LATCH Score       Type of Nipple: Everted at rest and after stimulation(short shaft)  Comfort  (Breast/Nipple): Filling, red/small blisters or bruises, mild/mod discomfort(Lt. nipple slightly tender)        Interventions Interventions: Breast feeding basics reviewed;Position options;Expressed milk;Breast massage;Coconut oil;Hand express;Pre-pump if needed;Reverse pressure;Breast compression;DEBP;Hand pump  Lactation Tools Discussed/Used Tools: Pump;Shells;Coconut oil Shell Type: Inverted Breast pump type: Manual;Double-Electric Breast Pump Pump Review: Setup, frequency, and cleaning;Milk Storage Initiated by:: Peri Jefferson RN IBCLC Date initiated:: 01/31/20   Consult Status Consult Status: Follow-up Date: 01/31/20 Follow-up type: In-patient    Charyl Dancer 01/31/2020, 12:53 AM

## 2020-01-31 NOTE — Lactation Note (Signed)
This note was copied from a baby's chart. Lactation Consultation Note  Patient Name: Caitlin Grant YDXAJ'O Date: 01/31/2020 Reason for consult: Primapara;1st time breastfeeding;Follow-up assessment;Term;Other (Comment)(mom confirmed she only wants to pump and bottle feed because is makes her feel less anxious) Baby is 79 hours old for D/C today  Per mom last fed at baby at 1030 am 30 ml.  LC discussed with mom since the baby is 50 hours old should feed at least 30 ml and be increasing to 45 ml per feeding. By the end 7 days - 45-60 ml.  LC reviewed the baby's stomach size and how it gradually stretches.  LC reviewed supply and demand / importance of consistent pumping around the clock for 15 - 20 mins 8-10 times in 24 hours. LC reminded the volume increasing is gradual and try not to get discouraged. Mom mentioned she was comfortable with the  #24 F. LC mentioned to mom when her milk comes in and her breast are fuller she may find the #24 F is to snug and to increase to the #27 F and when not has full decrease to the #24 F. Use the EBM to nipples liberally. Mom able to hand express well. Sore nipple and engorgement prevention and tx reviewed. LC also showed mom in the booklet for her resources and storage of breast milk.  Mom mentioned WIC had called her back and she will pick up her DEBP tomorrow. In the mean time will use her hand pump and LC showed her how to use the DEBP set up manually with dads help. LC  Reviewed pump set up and made sure moms knows what items to take with her.  Mom is aware WIC, [facility] are resources if she has questions for pumping and milk supply. Has the phone numbers.   Maternal Data Has patient been taught Hand Expression?: Yes(mom demostrated to [facility])  Feeding Feeding Type: (baby last fed at 1025 30 ml) Nipple Type: Slow - flow  LATCH Score                   Interventions Interventions: Breast feeding basics reviewed  Lactation  Tools Discussed/Used Tools: Pump;Shells;Flanges Flange Size: 24;27 Shell Type: Inverted Breast pump type: Manual;Double-Electric Breast Pump WIC Program: Yes(per mom will pick up her DEBP at [facility] tomorrow) Pump Review: Setup, frequency, and cleaning;Milk Storage Initiated by:: MAI / reviewed prior to D/C 2/16   Consult Status Consult Status: Complete Date: 01/31/20    Kathrin Greathouse 01/31/2020, 12:35 PM

## 2020-02-06 ENCOUNTER — Telehealth: Payer: Self-pay

## 2020-02-06 NOTE — Telephone Encounter (Signed)
A voicemail was left for the patient asking her to contact our office for an appointment.

## 2020-03-01 ENCOUNTER — Telehealth (INDEPENDENT_AMBULATORY_CARE_PROVIDER_SITE_OTHER): Payer: Medicaid Other

## 2020-03-01 DIAGNOSIS — Z1389 Encounter for screening for other disorder: Secondary | ICD-10-CM
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
## 2020-03-26 ENCOUNTER — Telehealth: Payer: Self-pay

## 2020-03-26 NOTE — Telephone Encounter (Signed)
Returned call and pt is complaining of possible uti, advised patient that I would forward to scheduler to contact for appt for urine sample.

## 2020-03-28 ENCOUNTER — Ambulatory Visit (INDEPENDENT_AMBULATORY_CARE_PROVIDER_SITE_OTHER): Payer: Medicaid Other

## 2020-03-28 ENCOUNTER — Other Ambulatory Visit: Payer: Self-pay

## 2020-03-28 DIAGNOSIS — R3915 Urgency of urination: Secondary | ICD-10-CM

## 2020-03-28 DIAGNOSIS — R309 Painful micturition, unspecified: Secondary | ICD-10-CM

## 2020-03-28 LAB — POCT URINALYSIS DIPSTICK
Bilirubin, UA: NEGATIVE
Glucose, UA: NEGATIVE
Ketones, UA: NEGATIVE
Nitrite, UA: NEGATIVE
Protein, UA: NEGATIVE
Spec Grav, UA: >=1.03 — AB (ref 1.010–1.025)
Urobilinogen, UA: 0.2 E.U./dL
pH, UA: 7 (ref 5.0–8.0)

## 2020-03-28 MED ORDER — NITROFURANTOIN MONOHYD MACRO 100 MG PO CAPS: 100.0000 mg | ORAL_CAPSULE | Freq: Two times a day (BID) | ORAL | 0 refills | Status: DC

## 2020-03-28 MED ORDER — PHENAZOPYRIDINE HCL 200 MG PO TABS: 200.0000 mg | ORAL_TABLET | Freq: Three times a day (TID) | ORAL | 0 refills | Status: DC | PRN

## 2020-03-28 NOTE — Progress Notes (Signed)
Caitlin Grant is here for UTI sx.  Pt reports having burning, odor, spotting, and urinary frequency for the past 6 days.  Pt denied taking OTC meds.  Macrobid and pyridium were both sent to her pharmacy per protocol. Urine sent for culture. Results pending. -EH/RMA

## 2020-03-29 NOTE — Progress Notes (Signed)
Patient ID: Caitlin Grant, female   DOB: 12-18-1995, 24 y.o.   MRN: [id number] Patient seen and assessed by nursing staff during this encounter. I have reviewed the chart and agree with the documentation and plan. I have also made any necessary editorial changes.  Scheryl Darter, MD 03/29/2020 9:27 AM

## 2020-03-30 LAB — URINE CULTURE

## 2020-04-11 DIAGNOSIS — O99893 Other specified diseases and conditions complicating puerperium: Secondary | ICD-10-CM | POA: Diagnosis not present

## 2020-04-11 DIAGNOSIS — R300 Dysuria: Secondary | ICD-10-CM | POA: Diagnosis not present

## 2020-04-27 ENCOUNTER — Emergency Department (HOSPITAL_COMMUNITY): Payer: Medicaid Other

## 2020-04-27 ENCOUNTER — Emergency Department (HOSPITAL_COMMUNITY)
Admission: EM | Admit: 2020-04-27 | Discharge: 2020-04-27 | Disposition: A | Discharge status: Home / Self Care | Payer: Medicaid Other | Attending: Emergency Medicine | Admitting: Emergency Medicine

## 2020-04-27 ENCOUNTER — Encounter (HOSPITAL_COMMUNITY): Payer: Self-pay

## 2020-04-27 DIAGNOSIS — S99912A Unspecified injury of left ankle, initial encounter: Secondary | ICD-10-CM | POA: Diagnosis not present

## 2020-04-27 DIAGNOSIS — I959 Hypotension, unspecified: Secondary | ICD-10-CM | POA: Diagnosis not present

## 2020-04-27 DIAGNOSIS — M542 Cervicalgia: Secondary | ICD-10-CM | POA: Diagnosis not present

## 2020-04-27 DIAGNOSIS — Z79899 Other long term (current) drug therapy: Secondary | ICD-10-CM | POA: Diagnosis not present

## 2020-04-27 DIAGNOSIS — S0990XA Unspecified injury of head, initial encounter: Secondary | ICD-10-CM | POA: Diagnosis not present

## 2020-04-27 DIAGNOSIS — M7918 Myalgia, other site: Secondary | ICD-10-CM | POA: Diagnosis not present

## 2020-04-27 DIAGNOSIS — R52 Pain, unspecified: Secondary | ICD-10-CM

## 2020-04-27 DIAGNOSIS — R519 Headache, unspecified: Secondary | ICD-10-CM | POA: Insufficient documentation

## 2020-04-27 DIAGNOSIS — Z87891 Personal history of nicotine dependence: Secondary | ICD-10-CM | POA: Insufficient documentation

## 2020-04-27 DIAGNOSIS — S199XXA Unspecified injury of neck, initial encounter: Secondary | ICD-10-CM | POA: Diagnosis not present

## 2020-04-27 DIAGNOSIS — S299XXA Unspecified injury of thorax, initial encounter: Secondary | ICD-10-CM | POA: Diagnosis not present

## 2020-04-27 LAB — CBC WITH DIFFERENTIAL/PLATELET
Abs Immature Granulocytes: 0.03 10*3/uL (ref 0.00–0.07)
Basophils Absolute: 0.1 10*3/uL (ref 0.0–0.1)
Basophils Relative: 1 %
Eosinophils Absolute: 0.2 10*3/uL (ref 0.0–0.5)
Eosinophils Relative: 2 %
HCT: 38.8 % (ref 36.0–46.0)
Hemoglobin: 13.8 g/dL (ref 12.0–15.0)
Immature Granulocytes: 0 %
Lymphocytes Relative: 37 %
Lymphs Abs: 3.2 10*3/uL (ref 0.7–4.0)
MCH: 28.3 pg (ref 26.0–34.0)
MCHC: 35.6 g/dL (ref 30.0–36.0)
MCV: 79.7 fL — ABNORMAL LOW (ref 80.0–100.0)
Monocytes Absolute: 0.5 10*3/uL (ref 0.1–1.0)
Monocytes Relative: 6 %
Neutro Abs: 4.8 10*3/uL (ref 1.7–7.7)
Neutrophils Relative %: 54 %
Platelets: 316 10*3/uL (ref 150–400)
RBC: 4.87 MIL/uL (ref 3.87–5.11)
RDW: 13.8 % (ref 11.5–15.5)
WBC: 8.8 10*3/uL (ref 4.0–10.5)
nRBC: 0 % (ref 0.0–0.2)

## 2020-04-27 LAB — COMPREHENSIVE METABOLIC PANEL
ALT: 15 U/L (ref 0–44)
AST: 16 U/L (ref 15–41)
Albumin: 4 g/dL (ref 3.5–5.0)
Alkaline Phosphatase: 50 U/L (ref 38–126)
Anion gap: 9 (ref 5–15)
BUN: 10 mg/dL (ref 6–20)
CO2: 23 mmol/L (ref 22–32)
Calcium: 9.1 mg/dL (ref 8.9–10.3)
Chloride: 106 mmol/L (ref 98–111)
Creatinine, Ser: 0.7 mg/dL (ref 0.44–1.00)
GFR calc Af Amer: >60 mL/min (ref >60)
GFR calc non Af Amer: >60 mL/min (ref >60)
Glucose, Bld: 103 mg/dL — ABNORMAL HIGH (ref 70–99)
Potassium: 3.8 mmol/L (ref 3.5–5.1)
Sodium: 138 mmol/L (ref 135–145)
Total Bilirubin: 0.8 mg/dL (ref 0.3–1.2)
Total Protein: 6.9 g/dL (ref 6.5–8.1)

## 2020-04-27 LAB — HCG, QUANTITATIVE, PREGNANCY: hCG, Beta Chain, Quant, S: <1 m[IU]/mL (ref <5)

## 2020-04-27 MED ORDER — ACETAMINOPHEN 325 MG PO TABS
650.0000 mg | ORAL_TABLET | Freq: Once | ORAL | Status: AC
  Administered 2020-04-27: 650 mg via ORAL
  Filled 2020-04-27: qty 2

## 2020-04-27 MED ORDER — FENTANYL CITRATE (PF) 100 MCG/2ML IJ SOLN
50.0000 ug | Freq: Once | INTRAMUSCULAR | Status: AC
  Administered 2020-04-27: 50 ug via INTRAVENOUS
  Filled 2020-04-27: qty 2
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
## 2020-05-03 DIAGNOSIS — Z3042 Encounter for surveillance of injectable contraceptive: Secondary | ICD-10-CM | POA: Diagnosis not present

## 2020-05-03 DIAGNOSIS — Z30013 Encounter for initial prescription of injectable contraceptive: Secondary | ICD-10-CM | POA: Diagnosis not present

## 2020-05-03 DIAGNOSIS — F53 Postpartum depression: Secondary | ICD-10-CM | POA: Diagnosis not present

## 2020-05-03 DIAGNOSIS — O99345 Other mental disorders complicating the puerperium: Secondary | ICD-10-CM | POA: Diagnosis not present

## 2020-06-22 ENCOUNTER — Ambulatory Visit (HOSPITAL_COMMUNITY)
Admission: EM | Admit: 2020-06-22 | Discharge: 2020-06-22 | Disposition: A | Discharge status: Home / Self Care | Payer: Medicaid Other | Attending: Family Medicine | Admitting: Family Medicine

## 2020-06-22 ENCOUNTER — Encounter (HOSPITAL_COMMUNITY): Payer: Self-pay

## 2020-06-22 ENCOUNTER — Other Ambulatory Visit: Payer: Self-pay

## 2020-06-22 DIAGNOSIS — Z3202 Encounter for pregnancy test, result negative: Secondary | ICD-10-CM

## 2020-06-22 DIAGNOSIS — R300 Dysuria: Secondary | ICD-10-CM | POA: Diagnosis not present

## 2020-06-22 DIAGNOSIS — N939 Abnormal uterine and vaginal bleeding, unspecified: Secondary | ICD-10-CM | POA: Diagnosis not present

## 2020-06-22 LAB — POCT URINALYSIS DIP (DEVICE)
Bilirubin Urine: NEGATIVE
Glucose, UA: NEGATIVE mg/dL
Ketones, ur: NEGATIVE mg/dL
Nitrite: NEGATIVE
Protein, ur: NEGATIVE mg/dL
Specific Gravity, Urine: >=1.03 (ref 1.005–1.030)
Urobilinogen, UA: 0.2 mg/dL (ref 0.0–1.0)
pH: 5.5 (ref 5.0–8.0)

## 2020-06-22 LAB — POC URINE PREG, ED: Preg Test, Ur: NEGATIVE

## 2020-06-22 MED ORDER — NITROFURANTOIN MONOHYD MACRO 100 MG PO CAPS: 100.0000 mg | ORAL_CAPSULE | Freq: Two times a day (BID) | ORAL | 0 refills | Status: AC

## 2020-06-22 NOTE — Discharge Instructions (Signed)
I am completing a culture of your urine to confirm presence of urinary tract infection. Start antibiotics. If your culture is negative - no growth- you can stop the antibiotics.  We will also screen for BV with your vaginal swab to ensure this is not source of spotting.  It may be breakthrough bleeding/spotting related to your depo, however.  If develop pelvic pain, fevers, or otherwise worsening please return to be seen.

## 2020-06-22 NOTE — ED Provider Notes (Signed)
[facility]    CSN: [id number] Arrival date & time: 06/22/20  1841      History   Chief Complaint Chief Complaint  Patient presents with  . Dysuria  . Vaginal Bleeding    HPI [facility] is a 24 y.o. female.   [facility] presents with complaints of frequency and burning with urination. Small amounts of urine output. Started 1 week ago. No back pain. Maybe some blood to urine but also with some vaginal spotting. Started spotting yesterday. She is 5 months post partum and she got her first depo shot 2 months ago, due again in august. She has not had a period post partum. She is sexually active with one partner, doesn't use condoms, denies concerns for stds. No pelvic pain. No fevers. Denies any other vaginal discharge or odor. Has had BV in the past.    ROS per HPI, negative if not otherwise mentioned.      Past Medical History:  Diagnosis Date  . Depression   . Ectopic pregnancy     Patient Active Problem List   Diagnosis Date Noted  . SVD (spontaneous vaginal delivery) 01/31/2020  . Obstetrical laceration 01/31/2020  . Labor and delivery indication for care or intervention 01/29/2020  . History of herpes genitalis 01/29/2020  . Depression 01/29/2020  . Premature rupture of membranes 01/28/2020    Past Surgical History:  Procedure Laterality Date  . TONSILLECTOMY    . WISDOM TOOTH EXTRACTION      OB History    Gravida  5   Para  1   Term  1   Preterm      AB  4   Living  1     SAB  4   TAB      Ectopic      Multiple  0   Live Births  1            Home Medications    Prior to Admission medications   Medication Sig Start Date End Date Taking? Authorizing Provider  cetirizine (ZYRTEC) 10 MG tablet Take 10 mg by mouth daily.   Yes [provider]  medroxyPROGESTERone (DEPO-PROVERA) 150 MG/ML injection Inject 150 mg into the muscle every 3 (three) months.   Yes [provider]  loratadine  (CLARITIN) 10 MG tablet Take 10 mg by mouth at bedtime.  11/08/19   [provider]  nitrofurantoin, macrocrystal-monohydrate, (MACROBID) 100 MG capsule Take 1 capsule (100 mg total) by mouth 2 (two) times daily for 5 days. 06/22/20 06/27/20  Georgetta Haber, NP  phenazopyridine (PYRIDIUM) 200 MG tablet Take 1 tablet (200 mg total) by mouth 3 (three) times daily as needed for pain. 03/28/20   Adam Phenix, MD  Prenatal Vit-Fe Fumarate-FA (PRENATAL MULTIVITAMIN) TABS tablet Take 1 tablet by mouth at bedtime.    [provider]  sertraline (ZOLOFT) 50 MG tablet Take 50 mg by mouth daily.    [provider]  valACYclovir (VALTREX) 500 MG tablet Take 500 mg by mouth daily.    [provider]  norethindrone (ORTHO MICRONOR) 0.35 MG tablet Take 1 tablet (0.35 mg total) by mouth daily. Patient not taking: Reported on 03/01/2020 01/31/20 04/27/20  Gerrit Heck, CNM    Family History Family History  Problem Relation Age of Onset  . Healthy Mother   . Healthy Father     Social History Social History   Tobacco Use  . Smoking status: Former Smoker    Packs/day:  0.50    Types: Cigarettes  . Smokeless tobacco: Never Used  . Tobacco comment: not while pregnant  Vaping Use  . Vaping Use: Never used  Substance Use Topics  . Alcohol use: Not Currently    Comment: occ  . Drug use: Not Currently    Types: Marijuana    Comment: March 2020     Allergies   Penicillins and Latex   Review of Systems Review of Systems   Physical Exam Triage Vital Signs ED Triage Vitals  Enc Vitals Group     BP 06/22/20 1933 123/83     Pulse Rate 06/22/20 1933 (!) 110     Resp 06/22/20 1933 18     Temp 06/22/20 1933 98.4 F (36.9 C)     Temp Source 06/22/20 1933 Oral     SpO2 06/22/20 1933 98 %     Weight --      Height --      Head Circumference --      Peak Flow --      Pain Score 06/22/20 1931 0     Pain Loc --      Pain Edu? --      Excl. in GC? --    No  data found.  Updated Vital Signs BP 123/83 (BP Location: Left Arm)   Pulse (!) 110   Temp 98.4 F (36.9 C) (Oral)   Resp 18   SpO2 98%    Physical Exam Constitutional:      General: She is not in acute distress.    Appearance: She is well-developed.  Cardiovascular:     Rate and Rhythm: Normal rate.  Pulmonary:     Effort: Pulmonary effort is normal.  Abdominal:     Palpations: Abdomen is not rigid.     Tenderness: There is no abdominal tenderness. There is no guarding or rebound.  Genitourinary:    Comments: Denies sores, lesions, vaginal bleeding; no pelvic pain; gu exam deferred at this time, vaginal self swab collected.   Skin:    General: Skin is warm and dry.  Neurological:     Mental Status: She is alert and oriented to person, place, and time.      UC Treatments / Results  Labs (all labs ordered are listed, but only abnormal results are displayed) Labs Reviewed  POCT URINALYSIS DIP (DEVICE) - Abnormal; Notable for the following components:      Result Value   Hgb urine dipstick MODERATE (*)    Leukocytes,Ua MODERATE (*)    All other components within normal limits  URINE CULTURE  POC URINE PREG, ED  CERVICOVAGINAL ANCILLARY ONLY    EKG   Radiology No results found.  Procedures Procedures (including critical care time)  Medications Ordered in UC Medications - No data to display  Initial Impression / Assessment and Plan / UC Course  I have reviewed the triage vital signs and the nursing notes.  Pertinent labs & imaging results that were available during my care of the patient were reviewed by me and considered in my medical decision making (see chart for details).     Mild tachycardia with urinary symptoms as well as leuks and hgb to urine. Culture obtained as also question contaminate to sample. Vaginal cytology also collected and pending. Breakthrough vaginal bleeding likely and discussed with patient as well. Return precautions provided.  Patient verbalized understanding and agreeable to plan.   Final Clinical Impressions(s) / UC Diagnoses   Final diagnoses:  Dysuria  Vaginal spotting     Discharge Instructions     I am completing a culture of your urine to confirm presence of urinary tract infection. Start antibiotics. If your culture is negative - no growth- you can stop the antibiotics.  We will also screen for BV with your vaginal swab to ensure this is not source of spotting.  It may be breakthrough bleeding/spotting related to your depo, however.  If develop pelvic pain, fevers, or otherwise worsening please return to be seen.    ED Prescriptions    Medication Sig Dispense Auth. Provider   nitrofurantoin, macrocrystal-monohydrate, (MACROBID) 100 MG capsule Take 1 capsule (100 mg total) by mouth 2 (two) times daily for 5 days. 10 capsule Georgetta Haber, NP     PDMP not reviewed this encounter.   Georgetta Haber, NP 06/22/20 2021

## 2020-06-22 NOTE — ED Triage Notes (Signed)
Pt reports see blood spots in the underwear yesterday, burning whne urinating x 1 week.

## 2020-06-24 LAB — URINE CULTURE: Culture: NO GROWTH

## 2020-06-25 LAB — CERVICOVAGINAL ANCILLARY ONLY
Bacterial Vaginitis (gardnerella): NEGATIVE
Candida Glabrata: NEGATIVE
Candida Vaginitis: POSITIVE — AB
Chlamydia: NEGATIVE
Comment: NEGATIVE
Comment: NEGATIVE
Comment: NEGATIVE
Comment: NEGATIVE
Comment: NEGATIVE
Comment: NORMAL
Neisseria Gonorrhea: NEGATIVE
Trichomonas: NEGATIVE

## 2020-06-26 ENCOUNTER — Telehealth (HOSPITAL_COMMUNITY): Payer: Self-pay

## 2020-06-26 MED ORDER — FLUCONAZOLE 150 MG PO TABS
150.0000 mg | ORAL_TABLET | Freq: Every day | ORAL | 0 refills | Status: AC
Start: 2020-06-26 — End: 2020-06-28

## 2020-07-20 DIAGNOSIS — Z3042 Encounter for surveillance of injectable contraceptive: Secondary | ICD-10-CM | POA: Diagnosis not present

## 2020-07-30 ENCOUNTER — Ambulatory Visit (HOSPITAL_COMMUNITY)
Admission: EM | Admit: 2020-07-30 | Discharge: 2020-07-30 | Discharge status: Against Medical Advice | Payer: Medicaid Other

## 2020-10-07 ENCOUNTER — Ambulatory Visit (HOSPITAL_COMMUNITY)
Admission: EM | Admit: 2020-10-07 | Discharge: 2020-10-07 | Disposition: A | Discharge status: Home / Self Care | Payer: Medicaid Other | Attending: Family Medicine | Admitting: Family Medicine

## 2020-10-07 ENCOUNTER — Other Ambulatory Visit: Payer: Self-pay

## 2020-10-07 ENCOUNTER — Encounter (HOSPITAL_COMMUNITY): Payer: Self-pay

## 2020-10-07 DIAGNOSIS — Z8619 Personal history of other infectious and parasitic diseases: Secondary | ICD-10-CM | POA: Insufficient documentation

## 2020-10-07 DIAGNOSIS — Z20822 Contact with and (suspected) exposure to covid-19: Secondary | ICD-10-CM | POA: Insufficient documentation

## 2020-10-07 DIAGNOSIS — F329 Major depressive disorder, single episode, unspecified: Secondary | ICD-10-CM | POA: Insufficient documentation

## 2020-10-07 DIAGNOSIS — Z87891 Personal history of nicotine dependence: Secondary | ICD-10-CM | POA: Diagnosis not present

## 2020-10-07 DIAGNOSIS — H01005 Unspecified blepharitis left lower eyelid: Secondary | ICD-10-CM | POA: Diagnosis not present

## 2020-10-07 DIAGNOSIS — L309 Dermatitis, unspecified: Secondary | ICD-10-CM | POA: Diagnosis not present

## 2020-10-07 DIAGNOSIS — H01002 Unspecified blepharitis right lower eyelid: Secondary | ICD-10-CM | POA: Insufficient documentation

## 2020-10-07 DIAGNOSIS — Z9109 Other allergy status, other than to drugs and biological substances: Secondary | ICD-10-CM | POA: Insufficient documentation

## 2020-10-07 DIAGNOSIS — Z760 Encounter for issue of repeat prescription: Secondary | ICD-10-CM | POA: Diagnosis not present

## 2020-10-07 DIAGNOSIS — Z79899 Other long term (current) drug therapy: Secondary | ICD-10-CM | POA: Diagnosis not present

## 2020-10-07 LAB — SARS CORONAVIRUS 2 (TAT 6-24 HRS): SARS Coronavirus 2: NEGATIVE

## 2020-10-07 MED ORDER — PREDNISONE 10 MG (21) PO TBPK
ORAL_TABLET | Freq: Every day | ORAL | 0 refills | Status: DC
Start: 2020-10-07 — End: 2021-09-02

## 2020-10-07 MED ORDER — VALACYCLOVIR HCL 500 MG PO TABS: 500.0000 mg | ORAL_TABLET | Freq: Two times a day (BID) | ORAL | 1 refills | Status: DC

## 2020-10-07 MED ORDER — ERYTHROMYCIN 5 MG/GM OP OINT
1.0000 | TOPICAL_OINTMENT | Freq: Every day | OPHTHALMIC | 0 refills | Status: DC
Start: 2020-10-07 — End: 2021-09-02

## 2020-10-07 MED ORDER — TRIAMCINOLONE ACETONIDE 0.1 % EX CREA
1.0000 | TOPICAL_CREAM | Freq: Two times a day (BID) | CUTANEOUS | 0 refills | Status: DC
Start: 2020-10-07 — End: 2021-09-02

## 2020-10-07 NOTE — ED Provider Notes (Signed)
[facility]    CSN: [id number] Arrival date & time: 10/07/20  1306      History   Chief Complaint Chief Complaint  Patient presents with  . Rash  . Medication Refill    Valtrex refill     HPI [facility]ena [facility] is a 24 y.o. female.   HPI  Patient has environmental allergies.  She states they are not responding to Claritin or Zyrtec.  She has some swelling around her eyes.  She is used a number of allergy eyedrops as well.  She has rash on her skin that itches.  When younger she had eczema.  This resembles her eczema.  She has been using a variety of creams.  They have not cleared up the rash.  In addition patient has a history of genital herpes.  Would like a refill of her valacyclovir. Patient reports that there is mold in her apartment.  She thinks this is contributing to what her allergies are hard to treat.  She has swelling of upper and lower lids.  Some crusting of her eyes in the morning.  No trouble with vision.  No cough or shortness of breath  Past Medical History:  Diagnosis Date  . Depression   . Ectopic pregnancy     Patient Active Problem List   Diagnosis Date Noted  . SVD (spontaneous vaginal delivery) 01/31/2020  . Obstetrical laceration 01/31/2020  . Labor and delivery indication for care or intervention 01/29/2020  . History of herpes genitalis 01/29/2020  . Depression 01/29/2020  . Premature rupture of membranes 01/28/2020    Past Surgical History:  Procedure Laterality Date  . TONSILLECTOMY    . WISDOM TOOTH EXTRACTION      OB History    Gravida  5   Para  1   Term  1   Preterm      AB  4   Living  1     SAB  4   TAB      Ectopic      Multiple  0   Live Births  1            Home Medications    Prior to Admission medications   Medication Sig Start Date End Date Taking? Authorizing Provider  sertraline (ZOLOFT) 50 MG tablet Take 50 mg by mouth daily.   Yes [provider]  cetirizine (ZYRTEC) 10 MG  tablet Take 10 mg by mouth daily.    [provider]  erythromycin ophthalmic ointment Place 1 application into both eyes at bedtime. Place a 1/4 inch ribbon of ointment into the lower eyelid. 10/07/20   Eustace Moore, MD  loratadine (CLARITIN) 10 MG tablet Take 10 mg by mouth at bedtime.  11/08/19   [provider]  medroxyPROGESTERone (DEPO-PROVERA) 150 MG/ML injection Inject 150 mg into the muscle every 3 (three) months.    [provider]  phenazopyridine (PYRIDIUM) 200 MG tablet Take 1 tablet (200 mg total) by mouth 3 (three) times daily as needed for pain. 03/28/20   Adam Phenix, MD  predniSONE (STERAPRED UNI-PAK 21 TAB) 10 MG (21) TBPK tablet Take by mouth daily. Take 6 tabs by mouth daily  for 2 days, then 5 tabs for 2 days, then 4 tabs for 2 days, then 3 tabs for 2 days, 2 tabs for 2 days, then 1 tab by mouth daily for 2 days 10/07/20   Eustace Moore, MD  Prenatal Vit-Fe Fumarate-FA (PRENATAL MULTIVITAMIN) TABS tablet  Take 1 tablet by mouth at bedtime.    [provider]  triamcinolone cream (KENALOG) 0.1 % Apply 1 application topically 2 (two) times daily. 10/07/20   Eustace Moore, MD  valACYclovir (VALTREX) 500 MG tablet Take 1 tablet (500 mg total) by mouth 2 (two) times daily. 10/07/20   Eustace Moore, MD  norethindrone (ORTHO MICRONOR) 0.35 MG tablet Take 1 tablet (0.35 mg total) by mouth daily. Patient not taking: Reported on 03/01/2020 01/31/20 04/27/20  Gerrit Heck, CNM    Family History Family History  Problem Relation Age of Onset  . Healthy Mother   . Healthy Father     Social History Social History   Tobacco Use  . Smoking status: Former Smoker    Packs/day: 0.50    Types: Cigarettes  . Smokeless tobacco: Never Used  . Tobacco comment: not while pregnant  Vaping Use  . Vaping Use: Never used  Substance Use Topics  . Alcohol use: Yes  . Drug use: Not Currently    Types: Marijuana    Comment: March 2020      Allergies   Penicillins and Latex   Review of Systems Review of Systems  See HPI Physical Exam Triage Vital Signs ED Triage Vitals  Enc Vitals Group     BP 10/07/20 1432 132/88     Pulse Rate 10/07/20 1432 78     Resp 10/07/20 1432 20     Temp 10/07/20 1432 98.4 F (36.9 C)     Temp Source 10/07/20 1432 Oral     SpO2 10/07/20 1432 99 %     Weight --      Height --      Head Circumference --      Peak Flow --      Pain Score 10/07/20 1428 0     Pain Loc --      Pain Edu? --      Excl. in GC? --    No data found.  Updated Vital Signs BP 132/88 (BP Location: Right Arm)   Pulse 78   Temp 98.4 F (36.9 C) (Oral)   Resp 20   LMP 10/04/2020   SpO2 99%  :     Physical Exam Constitutional:      General: She is not in acute distress.    Appearance: She is well-developed.  HENT:     Head: Normocephalic and atraumatic.     Right Ear: Tympanic membrane and ear canal normal.     Left Ear: Tympanic membrane and ear canal normal.     Nose: Congestion present.     Comments: Nasal congestion.  Clear rhinorrhea.    Mouth/Throat:     Mouth: Mucous membranes are moist.  Eyes:     Conjunctiva/sclera: Conjunctivae normal.     Pupils: Pupils are equal, round, and reactive to light.     Comments: Mild conjunctival injection.  No discharge.  Upper and lower lids are puffy  Cardiovascular:     Rate and Rhythm: Normal rate.  Pulmonary:     Effort: Pulmonary effort is normal. No respiratory distress.     Breath sounds: Normal breath sounds.  Abdominal:     General: There is distension.     Palpations: Abdomen is soft.  Musculoskeletal:        General: Normal range of motion.     Cervical back: Normal range of motion.  Skin:    General: Skin is warm and dry.     Comments:  Patches of hyperpigmented scaling macules noted on both antecubital fossa, both lower legs, anterior neck  Neurological:     Mental Status: She is alert.  Psychiatric:        Mood and Affect: Mood  normal.        Behavior: Behavior normal.      UC Treatments / Results  Labs (all labs ordered are listed, but only abnormal results are displayed) Labs Reviewed  SARS CORONAVIRUS 2 (TAT 6-24 HRS)    EKG   Radiology No results found.  Procedures Procedures (including critical care time)  Medications Ordered in UC Medications - No data to display  Initial Impression / Assessment and Plan / UC Course  I have reviewed the triage vital signs and the nursing notes.  Pertinent labs & imaging results that were available during my care of the patient were reviewed by me and considered in my medical decision making (see chart for details).      Final Clinical Impressions(s) / UC Diagnoses   Final diagnoses:  History of herpes genitalis  Environmental allergies  Eczema, unspecified type  Blepharitis of lower eyelids of both eyes, unspecified type     Discharge Instructions     Take the prednisone as directed This will reduce allergy symptoms , and rash Use the triamcinolone 2 x a day for skin rash Use the eye ointment at bedtime Try allegra for allergy symptoms    ED Prescriptions    Medication Sig Dispense Auth. Provider   valACYclovir (VALTREX) 500 MG tablet Take 1 tablet (500 mg total) by mouth 2 (two) times daily. 10 tablet Eustace Moore, MD   triamcinolone cream (KENALOG) 0.1 % Apply 1 application topically 2 (two) times daily. 30 g Eustace Moore, MD   predniSONE (STERAPRED UNI-PAK 21 TAB) 10 MG (21) TBPK tablet Take by mouth daily. Take 6 tabs by mouth daily  for 2 days, then 5 tabs for 2 days, then 4 tabs for 2 days, then 3 tabs for 2 days, 2 tabs for 2 days, then 1 tab by mouth daily for 2 days 42 tablet Eustace Moore, MD   erythromycin ophthalmic ointment Place 1 application into both eyes at bedtime. Place a 1/4 inch ribbon of ointment into the lower eyelid. 1 g Eustace Moore, MD     PDMP not reviewed this encounter.   Eustace Moore, MD 10/07/20 [phone]

## 2020-10-07 NOTE — Discharge Instructions (Signed)
Take the prednisone as directed This will reduce allergy symptoms , and rash Use the triamcinolone 2 x a day for skin rash Use the eye ointment at bedtime Try allegra for allergy symptoms

## 2020-10-07 NOTE — ED Triage Notes (Signed)
Patient presents to Urgent Care with complaints of generalized body rash appeared 3 weeks ago, has applied hydrocortisone, neosporin, aquiphor, vaseline to rash with no relief. Patient reports swelling to eyes and has used several; OTC eye drops for about a week. No relief.

## 2020-10-15 ENCOUNTER — Encounter (HOSPITAL_COMMUNITY): Payer: Self-pay

## 2020-10-15 ENCOUNTER — Other Ambulatory Visit: Payer: Self-pay

## 2020-10-15 ENCOUNTER — Ambulatory Visit (HOSPITAL_COMMUNITY)
Admission: EM | Admit: 2020-10-15 | Discharge: 2020-10-15 | Disposition: A | Discharge status: Home / Self Care | Payer: Medicaid Other | Attending: Family Medicine | Admitting: Family Medicine

## 2020-10-15 DIAGNOSIS — R300 Dysuria: Secondary | ICD-10-CM

## 2020-10-15 DIAGNOSIS — H00015 Hordeolum externum left lower eyelid: Secondary | ICD-10-CM | POA: Insufficient documentation

## 2020-10-15 LAB — POCT URINALYSIS DIPSTICK, ED / UC
Bilirubin Urine: NEGATIVE
Glucose, UA: NEGATIVE mg/dL
Ketones, ur: NEGATIVE mg/dL
Leukocytes,Ua: NEGATIVE
Nitrite: NEGATIVE
Protein, ur: NEGATIVE mg/dL
Specific Gravity, Urine: >=1.03 (ref 1.005–1.030)
Urobilinogen, UA: 0.2 mg/dL (ref 0.0–1.0)
pH: 5.5 (ref 5.0–8.0)

## 2020-10-15 MED ORDER — SULFAMETHOXAZOLE-TRIMETHOPRIM 800-160 MG PO TABS: 1.0000 | ORAL_TABLET | Freq: Two times a day (BID) | ORAL | 0 refills | Status: AC

## 2020-10-15 NOTE — Discharge Instructions (Signed)
Take the antibiotic 2 times a day Drink more water Use warm compresses to your left eye a couple of times a day This antibiotic will help both eye infection and urine infection

## 2020-10-15 NOTE — ED Provider Notes (Signed)
[facility]    CSN: [id number] Arrival date & time: 10/15/20  7253      History   Chief Complaint Chief Complaint  Patient presents with  . Eye Problem  . Dysuria    HPI Caitlin Grant is a 24 y.o. female.   HPI  Patient was here last week.  Had bilateral lid swelling irritation due to allergies.  She has been using the erythromycin ointment at night.  Still has some irritation in the left lower lid.  She.  Alert and shows me that she has a small stye in this area.  She states that it is improving over time. In addition patient states that she has dysuria and frequency.  This has been going on for couple of weeks.  It is getting worse.  She does not feel she has any vaginal discharge or infection.  Declines need for STD testing.  No abdominal pain.  No flank pain.  No fever chills.  No nausea vomiting  Past Medical History:  Diagnosis Date  . Depression   . Ectopic pregnancy     Patient Active Problem List   Diagnosis Date Noted  . SVD (spontaneous vaginal delivery) 01/31/2020  . Obstetrical laceration 01/31/2020  . Labor and delivery indication for care or intervention 01/29/2020  . History of herpes genitalis 01/29/2020  . Depression 01/29/2020  . Premature rupture of membranes 01/28/2020    Past Surgical History:  Procedure Laterality Date  . TONSILLECTOMY    . WISDOM TOOTH EXTRACTION      OB History    Gravida  5   Para  1   Term  1   Preterm      AB  4   Living  1     SAB  4   TAB      Ectopic      Multiple  0   Live Births  1            Home Medications    Prior to Admission medications   Medication Sig Start Date End Date Taking? Authorizing Provider  cetirizine (ZYRTEC) 10 MG tablet Take 10 mg by mouth daily.   Yes [provider]  erythromycin ophthalmic ointment Place 1 application into both eyes at bedtime. Place a 1/4 inch ribbon of ointment into the lower eyelid. 10/07/20   Eustace Moore, MD    loratadine (CLARITIN) 10 MG tablet Take 10 mg by mouth at bedtime.  11/08/19   [provider]  medroxyPROGESTERone (DEPO-PROVERA) 150 MG/ML injection Inject 150 mg into the muscle every 3 (three) months.    [provider]  phenazopyridine (PYRIDIUM) 200 MG tablet Take 1 tablet (200 mg total) by mouth 3 (three) times daily as needed for pain. 03/28/20   Adam Phenix, MD  predniSONE (STERAPRED UNI-PAK 21 TAB) 10 MG (21) TBPK tablet Take by mouth daily. Take 6 tabs by mouth daily  for 2 days, then 5 tabs for 2 days, then 4 tabs for 2 days, then 3 tabs for 2 days, 2 tabs for 2 days, then 1 tab by mouth daily for 2 days 10/07/20   Eustace Moore, MD  Prenatal Vit-Fe Fumarate-FA (PRENATAL MULTIVITAMIN) TABS tablet Take 1 tablet by mouth at bedtime.    [provider]  sertraline (ZOLOFT) 50 MG tablet Take 50 mg by mouth daily.    [provider]  sulfamethoxazole-trimethoprim (BACTRIM DS) 800-160 MG tablet Take 1 tablet by mouth 2 (two) times daily  for 7 days. 10/15/20 10/22/20  Eustace Moore, MD  triamcinolone cream (KENALOG) 0.1 % Apply 1 application topically 2 (two) times daily. 10/07/20   Eustace Moore, MD  valACYclovir (VALTREX) 500 MG tablet Take 1 tablet (500 mg total) by mouth 2 (two) times daily. 10/07/20   Eustace Moore, MD  norethindrone (ORTHO MICRONOR) 0.35 MG tablet Take 1 tablet (0.35 mg total) by mouth daily. Patient not taking: Reported on 03/01/2020 01/31/20 04/27/20  Gerrit Heck, CNM    Family History Family History  Problem Relation Age of Onset  . Healthy Mother   . Healthy Father     Social History Social History   Tobacco Use  . Smoking status: Former Smoker    Packs/day: 0.50    Types: Cigarettes  . Smokeless tobacco: Never Used  . Tobacco comment: not while pregnant  Vaping Use  . Vaping Use: Never used  Substance Use Topics  . Alcohol use: Yes  . Drug use: Not Currently    Types: Marijuana    Comment:  March 2020     Allergies   Penicillins and Latex   Review of Systems Review of Systems See HPI  Physical Exam Triage Vital Signs ED Triage Vitals  Enc Vitals Group     BP 10/15/20 1124 110/90     Pulse Rate 10/15/20 1124 (!) 114     Resp 10/15/20 1124 18     Temp 10/15/20 1124 99.7 F (37.6 C)     Temp Source 10/15/20 1124 Oral     SpO2 10/15/20 1124 100 %     Weight --      Height --      Head Circumference --      Peak Flow --      Pain Score 10/15/20 1121 4     Pain Loc --      Pain Edu? --      Excl. in GC? --    No data found.  Updated Vital Signs BP 110/90 (BP Location: Right Arm)   Pulse (!) 114   Temp 99.7 F (37.6 C) (Oral)   Resp 18   LMP 10/04/2020   SpO2 100%     Physical Exam Constitutional:      General: She is not in acute distress.    Appearance: She is well-developed.  HENT:     Head: Normocephalic and atraumatic.     Mouth/Throat:     Comments: Mask is in place Eyes:     Conjunctiva/sclera: Conjunctivae normal.     Pupils: Pupils are equal, round, and reactive to light.     Comments: Small stye inside the lower lid, left  Cardiovascular:     Rate and Rhythm: Normal rate.  Pulmonary:     Effort: Pulmonary effort is normal. No respiratory distress.  Abdominal:     General: There is no distension.     Palpations: Abdomen is soft.     Tenderness: There is no right CVA tenderness or left CVA tenderness.  Musculoskeletal:        General: Normal range of motion.     Cervical back: Normal range of motion.  Skin:    General: Skin is warm and dry.  Neurological:     Mental Status: She is alert.  Psychiatric:        Behavior: Behavior normal.      UC Treatments / Results  Labs (all labs ordered are listed, but only abnormal results are displayed) Labs Reviewed  POCT URINALYSIS DIPSTICK, ED / UC - Abnormal; Notable for the following components:      Result Value   Hgb urine dipstick TRACE (*)    All other components within  normal limits  URINE CULTURE    EKG   Radiology No results found.  Procedures Procedures (including critical care time)  Medications Ordered in UC Medications - No data to display  Initial Impression / Assessment and Plan / UC Course  I have reviewed the triage vital signs and the nursing notes.  Pertinent labs & imaging results that were available during my care of the patient were reviewed by me and considered in my medical decision making (see chart for details).     Patient does not have urine analysis finding consistent with UTI.  Declines vaginitis testing.  We will treat with sulfa, push fluids, return as needed.  I believe she needs antibiotics even if her culture is negative, for the infection. Final Clinical Impressions(s) / UC Diagnoses   Final diagnoses:  Dysuria  Hordeolum externum of left lower eyelid     Discharge Instructions     Take the antibiotic 2 times a day Drink more water Use warm compresses to your left eye a couple of times a day This antibiotic will help both eye infection and urine infection   ED Prescriptions    Medication Sig Dispense Auth. Provider   sulfamethoxazole-trimethoprim (BACTRIM DS) 800-160 MG tablet Take 1 tablet by mouth 2 (two) times daily for 7 days. 14 tablet Eustace Moore, MD     PDMP not reviewed this encounter.   Eustace Moore, MD 10/15/20 1440

## 2020-10-15 NOTE — ED Triage Notes (Signed)
Pt c/o left eye irritation. Was evaluated for same last week. Pt reports that she took Rx ointment for eye but it was not improving. Reports that yesterday she used alcohol and H2O2 on cotton ball/qtip and inserted "in eye" and on lid and reports since doing so, eye irritation has improved.  Also c/o painful urination, frequency, urgency for approx 1 month. Also reports abdominal pain, flank/back pain and diarrhea.  Denies fever, n/v, hematuria. Has been taking vinegar and cranberry juice.

## 2020-10-17 LAB — URINE CULTURE: Culture: >=100000 — AB

## 2020-12-01 ENCOUNTER — Encounter (HOSPITAL_COMMUNITY): Payer: Self-pay | Admitting: Emergency Medicine

## 2020-12-01 ENCOUNTER — Other Ambulatory Visit: Payer: Self-pay

## 2020-12-01 ENCOUNTER — Ambulatory Visit (HOSPITAL_COMMUNITY)
Admission: EM | Admit: 2020-12-01 | Discharge: 2020-12-01 | Disposition: A | Discharge status: Home / Self Care | Payer: Medicaid Other | Attending: Family Medicine | Admitting: Family Medicine

## 2020-12-01 DIAGNOSIS — H1013 Acute atopic conjunctivitis, bilateral: Secondary | ICD-10-CM | POA: Insufficient documentation

## 2020-12-01 DIAGNOSIS — N760 Acute vaginitis: Secondary | ICD-10-CM | POA: Insufficient documentation

## 2020-12-01 LAB — POCT URINALYSIS DIPSTICK, ED / UC
Bilirubin Urine: NEGATIVE
Glucose, UA: NEGATIVE mg/dL
Ketones, ur: NEGATIVE mg/dL
Leukocytes,Ua: NEGATIVE
Nitrite: NEGATIVE
Protein, ur: NEGATIVE mg/dL
Specific Gravity, Urine: >=1.03 (ref 1.005–1.030)
Urobilinogen, UA: 0.2 mg/dL (ref 0.0–1.0)
pH: 6 (ref 5.0–8.0)

## 2020-12-01 MED ORDER — METRONIDAZOLE 500 MG PO TABS
500.0000 mg | ORAL_TABLET | Freq: Two times a day (BID) | ORAL | 0 refills | Status: DC
Start: 2020-12-01 — End: 2021-09-02

## 2020-12-01 MED ORDER — AZELASTINE HCL 0.05 % OP SOLN: 1.0000 [drp] | Freq: Two times a day (BID) | OPHTHALMIC | 2 refills | Status: DC

## 2020-12-01 NOTE — ED Provider Notes (Signed)
[facility]    CSN: [id number] Arrival date & time: 12/01/20  1508      History   Chief Complaint Chief Complaint  Patient presents with  . Vaginal Discharge    HPI Caitlin Grant is a 24 y.o. female.   Here today with 2 week history of thick vaginal discharge, itching, irritation, and abnormal vaginal odor. She denies dysuria, abdominal pain, flank pain, fever, chills. Trying numerous OTC remedies without relief. No concern for STI or pregnancy.   She's also complaining of ongoing itching and clear watering of eyes that she thinks is related to allergies but oral antihistamines don't seem to help. Denies redness, blurry vision, headaches, N/V.      Past Medical History:  Diagnosis Date  . Depression   . Ectopic pregnancy     Patient Active Problem List   Diagnosis Date Noted  . SVD (spontaneous vaginal delivery) 01/31/2020  . Obstetrical laceration 01/31/2020  . Labor and delivery indication for care or intervention 01/29/2020  . History of herpes genitalis 01/29/2020  . Depression 01/29/2020  . Premature rupture of membranes 01/28/2020    Past Surgical History:  Procedure Laterality Date  . TONSILLECTOMY    . WISDOM TOOTH EXTRACTION      OB History    Gravida  5   Para  1   Term  1   Preterm      AB  4   Living  1     SAB  4   IAB      Ectopic      Multiple  0   Live Births  1            Home Medications    Prior to Admission medications   Medication Sig Start Date End Date Taking? Authorizing Provider  azelastine (OPTIVAR) 0.05 % ophthalmic solution Place 1 drop into both eyes 2 (two) times daily. 12/01/20   Particia Nearing, PA-C  cetirizine (ZYRTEC) 10 MG tablet Take 10 mg by mouth daily.    [provider]  erythromycin ophthalmic ointment Place 1 application into both eyes at bedtime. Place a 1/4 inch ribbon of ointment into the lower eyelid. 10/07/20   Eustace Moore, MD  loratadine  (CLARITIN) 10 MG tablet Take 10 mg by mouth at bedtime.  11/08/19   [provider]  medroxyPROGESTERone (DEPO-PROVERA) 150 MG/ML injection Inject 150 mg into the muscle every 3 (three) months.    [provider]  metroNIDAZOLE (FLAGYL) 500 MG tablet Take 1 tablet (500 mg total) by mouth 2 (two) times daily. 12/01/20   Particia Nearing, PA-C  phenazopyridine (PYRIDIUM) 200 MG tablet Take 1 tablet (200 mg total) by mouth 3 (three) times daily as needed for pain. 03/28/20   Adam Phenix, MD  predniSONE (STERAPRED UNI-PAK 21 TAB) 10 MG (21) TBPK tablet Take by mouth daily. Take 6 tabs by mouth daily  for 2 days, then 5 tabs for 2 days, then 4 tabs for 2 days, then 3 tabs for 2 days, 2 tabs for 2 days, then 1 tab by mouth daily for 2 days 10/07/20   Eustace Moore, MD  Prenatal Vit-Fe Fumarate-FA (PRENATAL MULTIVITAMIN) TABS tablet Take 1 tablet by mouth at bedtime.    [provider]  sertraline (ZOLOFT) 50 MG tablet Take 50 mg by mouth daily.    [provider]  triamcinolone cream (KENALOG) 0.1 % Apply 1 application topically 2 (two) times daily. 10/07/20  Eustace Moore, MD  valACYclovir (VALTREX) 500 MG tablet Take 1 tablet (500 mg total) by mouth 2 (two) times daily. 10/07/20   Eustace Moore, MD  norethindrone (ORTHO MICRONOR) 0.35 MG tablet Take 1 tablet (0.35 mg total) by mouth daily. Patient not taking: Reported on 03/01/2020 01/31/20 04/27/20  Gerrit Heck, CNM    Family History Family History  Problem Relation Age of Onset  . Healthy Mother   . Healthy Father     Social History Social History   Tobacco Use  . Smoking status: Former Smoker    Packs/day: 0.50    Types: Cigarettes  . Smokeless tobacco: Never Used  . Tobacco comment: not while pregnant  Vaping Use  . Vaping Use: Never used  Substance Use Topics  . Alcohol use: Yes  . Drug use: Not Currently    Types: Marijuana    Comment: March 2020     Allergies    Penicillins and Latex   Review of Systems Review of Systems PER HPI   Physical Exam Triage Vital Signs ED Triage Vitals  Enc Vitals Group     BP 12/01/20 1633 117/71     Pulse Rate 12/01/20 1633 95     Resp 12/01/20 1633 16     Temp 12/01/20 1633 98.2 F (36.8 C)     Temp Source 12/01/20 1633 Oral     SpO2 12/01/20 1633 98 %     Weight --      Height --      Head Circumference --      Peak Flow --      Pain Score 12/01/20 1631 0     Pain Loc --      Pain Edu? --      Excl. in GC? --    No data found.  Updated Vital Signs BP 117/71 (BP Location: Left Arm)   Pulse 95   Temp 98.2 F (36.8 C) (Oral)   Resp 16   LMP  (LMP Unknown)   SpO2 98%   Visual Acuity Right Eye Distance:   Left Eye Distance:   Bilateral Distance:    Right Eye Near:   Left Eye Near:    Bilateral Near:     Physical Exam Vitals and nursing note reviewed.  Constitutional:      Appearance: Normal appearance. She is not ill-appearing.  HENT:     Head: Atraumatic.  Eyes:     General:        Right eye: Discharge present.        Left eye: Discharge present.    Extraocular Movements: Extraocular movements intact.     Conjunctiva/sclera: Conjunctivae normal.     Pupils: Pupils are equal, round, and reactive to light.  Cardiovascular:     Rate and Rhythm: Normal rate and regular rhythm.     Heart sounds: Normal heart sounds.  Pulmonary:     Effort: Pulmonary effort is normal.     Breath sounds: Normal breath sounds.  Abdominal:     General: Bowel sounds are normal. There is no distension.     Palpations: Abdomen is soft.     Tenderness: There is no abdominal tenderness. There is no right CVA tenderness, left CVA tenderness or guarding.  Genitourinary:    Comments: GU exam deferred, self swab performed Musculoskeletal:        General: Normal range of motion.     Cervical back: Normal range of motion and neck supple.  Skin:  General: Skin is warm and dry.  Neurological:     Mental  Status: She is alert and oriented to person, place, and time.  Psychiatric:        Mood and Affect: Mood normal.        Thought Content: Thought content normal.        Judgment: Judgment normal.      UC Treatments / Results  Labs (all labs ordered are listed, but only abnormal results are displayed) Labs Reviewed  POCT URINALYSIS DIPSTICK, ED / UC - Abnormal; Notable for the following components:      Result Value   Hgb urine dipstick TRACE (*)    All other components within normal limits  CERVICOVAGINAL ANCILLARY ONLY    EKG   Radiology No results found.  Procedures Procedures (including critical care time)  Medications Ordered in UC Medications - No data to display  Initial Impression / Assessment and Plan / UC Course  I have reviewed the triage vital signs and the nursing notes.  Pertinent labs & imaging results that were available during my care of the patient were reviewed by me and considered in my medical decision making (see chart for details).     U/A benign, aptima swab pending. Will start flagyl for likely BV infection while awaiting remainder of results. Will start optivar drops to see if this benefits the eye issues she's having. Return precautions reviewed.   Final Clinical Impressions(s) / UC Diagnoses   Final diagnoses:  Acute vaginitis  Allergic conjunctivitis of both eyes   Discharge Instructions   None    ED Prescriptions    Medication Sig Dispense Auth. Provider   metroNIDAZOLE (FLAGYL) 500 MG tablet Take 1 tablet (500 mg total) by mouth 2 (two) times daily. 14 tablet Particia Nearing, [place]   azelastine (OPTIVAR) 0.05 % ophthalmic solution Place 1 drop into both eyes 2 (two) times daily. 6 mL Particia Nearing, [place]     PDMP not reviewed this encounter.   Roosvelt Maser [place], [place] 12/02/20 [phone]

## 2020-12-01 NOTE — ED Triage Notes (Signed)
Patient c/o abnormal vaginal discharge x 2 weeks.   Patient endorses "watery to thick and clumpy discharge". Patient endorses itching.   Patient endorses abnormal vaginal discharge smell.   Patient denies dysuria.   Patient has tried OTC medication with no relief of symptoms.

## 2020-12-03 LAB — CERVICOVAGINAL ANCILLARY ONLY
Bacterial Vaginitis (gardnerella): NEGATIVE
Candida Glabrata: NEGATIVE
Candida Vaginitis: NEGATIVE
Chlamydia: NEGATIVE
Comment: NEGATIVE
Comment: NEGATIVE
Comment: NEGATIVE
Comment: NEGATIVE
Comment: NEGATIVE
Comment: NORMAL
Neisseria Gonorrhea: NEGATIVE
Trichomonas: NEGATIVE

## 2020-12-11 ENCOUNTER — Other Ambulatory Visit: Payer: Self-pay

## 2020-12-11 ENCOUNTER — Encounter (HOSPITAL_COMMUNITY): Payer: Self-pay

## 2020-12-11 ENCOUNTER — Emergency Department (HOSPITAL_COMMUNITY)
Admission: EM | Admit: 2020-12-11 | Discharge: 2020-12-11 | Disposition: A | Discharge status: Home / Self Care | Payer: Medicaid Other | Attending: Emergency Medicine | Admitting: Emergency Medicine

## 2020-12-11 DIAGNOSIS — Z91040 Latex allergy status: Secondary | ICD-10-CM | POA: Diagnosis not present

## 2020-12-11 DIAGNOSIS — F53 Postpartum depression: Secondary | ICD-10-CM | POA: Diagnosis not present

## 2020-12-11 DIAGNOSIS — Z79899 Other long term (current) drug therapy: Secondary | ICD-10-CM | POA: Insufficient documentation

## 2020-12-11 DIAGNOSIS — Z87891 Personal history of nicotine dependence: Secondary | ICD-10-CM | POA: Diagnosis not present

## 2020-12-11 DIAGNOSIS — F32A Depression, unspecified: Secondary | ICD-10-CM | POA: Diagnosis present

## 2020-12-11 NOTE — ED Provider Notes (Signed)
[facility]-EMERGENCY DEPT Provider Note   CSN: [id number] Arrival date & time: 12/11/20  1231     History Chief Complaint  Patient presents with  . Depression    Caitlin Grant is a 24 y.o. female.  HPI 24 year old female presents for depression.  She has been having depression ever since she delivered her baby about 10 months ago.  At first Zoloft seem to help while pregnant but does not help now.  Her OB/GYN states that she can no longer help her and she needs a psychiatrist.  The patient went to [facility] but it was closed for the holidays.  Has a therapist evaluation tomorrow but feels she needs to strongly be on medicine so she came here.  She is not suicidal and does not have any thoughts of wanting to harm her baby.   Past Medical History:  Diagnosis Date  . Depression   . Ectopic pregnancy     Patient Active Problem List   Diagnosis Date Noted  . SVD (spontaneous vaginal delivery) 01/31/2020  . Obstetrical laceration 01/31/2020  . Labor and delivery indication for care or intervention 01/29/2020  . History of herpes genitalis 01/29/2020  . Depression 01/29/2020  . Premature rupture of membranes 01/28/2020    Past Surgical History:  Procedure Laterality Date  . TONSILLECTOMY    . WISDOM TOOTH EXTRACTION       OB History    Gravida  5   Para  1   Term  1   Preterm      AB  4   Living  1     SAB  4   IAB      Ectopic      Multiple  0   Live Births  1           Family History  Problem Relation Age of Onset  . Healthy Mother   . Healthy Father     Social History   Tobacco Use  . Smoking status: Former Smoker    Packs/day: 0.50    Types: Cigarettes  . Smokeless tobacco: Never Used  . Tobacco comment: not while pregnant  Vaping Use  . Vaping Use: Never used  Substance Use Topics  . Alcohol use: Yes  . Drug use: Not Currently    Types: Marijuana    Comment: March 2020    Home Medications Prior to  Admission medications   Medication Sig Start Date End Date Taking? Authorizing Provider  azelastine (OPTIVAR) 0.05 % ophthalmic solution Place 1 drop into both eyes 2 (two) times daily. 12/01/20   Particia Nearing, PA-C  cetirizine (ZYRTEC) 10 MG tablet Take 10 mg by mouth daily.    [provider]  erythromycin ophthalmic ointment Place 1 application into both eyes at bedtime. Place a 1/4 inch ribbon of ointment into the lower eyelid. 10/07/20   Eustace Moore, MD  loratadine (CLARITIN) 10 MG tablet Take 10 mg by mouth at bedtime.  11/08/19   [provider]  medroxyPROGESTERone (DEPO-PROVERA) 150 MG/ML injection Inject 150 mg into the muscle every 3 (three) months.    [provider]  metroNIDAZOLE (FLAGYL) 500 MG tablet Take 1 tablet (500 mg total) by mouth 2 (two) times daily. 12/01/20   Particia Nearing, PA-C  phenazopyridine (PYRIDIUM) 200 MG tablet Take 1 tablet (200 mg total) by mouth 3 (three) times daily as needed for pain. 03/28/20   Adam Phenix, MD  predniSONE (STERAPRED UNI-PAK 21 TAB)  10 MG (21) TBPK tablet Take by mouth daily. Take 6 tabs by mouth daily  for 2 days, then 5 tabs for 2 days, then 4 tabs for 2 days, then 3 tabs for 2 days, 2 tabs for 2 days, then 1 tab by mouth daily for 2 days 10/07/20   Eustace Moore, MD  Prenatal Vit-Fe Fumarate-FA (PRENATAL MULTIVITAMIN) TABS tablet Take 1 tablet by mouth at bedtime.    [provider]  sertraline (ZOLOFT) 50 MG tablet Take 50 mg by mouth daily.    [provider]  triamcinolone cream (KENALOG) 0.1 % Apply 1 application topically 2 (two) times daily. 10/07/20   Eustace Moore, MD  valACYclovir (VALTREX) 500 MG tablet Take 1 tablet (500 mg total) by mouth 2 (two) times daily. 10/07/20   Eustace Moore, MD  norethindrone (ORTHO MICRONOR) 0.35 MG tablet Take 1 tablet (0.35 mg total) by mouth daily. Patient not taking: Reported on 03/01/2020 01/31/20 04/27/20  Gerrit Heck, CNM    Allergies    Penicillins and Latex  Review of Systems   Review of Systems  Constitutional: Negative for fever.  Respiratory: Negative for cough and shortness of breath.   Gastrointestinal: Negative for vomiting.  Psychiatric/Behavioral: Positive for dysphoric mood and sleep disturbance (sleeping more). Negative for suicidal ideas.  All other systems reviewed and are negative.   Physical Exam Updated Vital Signs BP (!) 147/91 (BP Location: Left Arm)   Pulse (!) 107   Temp 98.5 F (36.9 C) (Oral)   Resp 18   LMP  (LMP Unknown)   SpO2 99%   Physical Exam Vitals and nursing note reviewed.  Constitutional:      Appearance: She is well-developed and well-nourished.  HENT:     Head: Normocephalic and atraumatic.     Right Ear: External ear normal.     Left Ear: External ear normal.     Nose: Nose normal.  Eyes:     General:        Right eye: No discharge.        Left eye: No discharge.  Cardiovascular:     Rate and Rhythm: Normal rate and regular rhythm.     Heart sounds: Normal heart sounds.  Pulmonary:     Effort: Pulmonary effort is normal.     Breath sounds: Normal breath sounds.  Abdominal:     Palpations: Abdomen is soft.     Tenderness: There is no abdominal tenderness.  Skin:    General: Skin is warm and dry.  Neurological:     Mental Status: She is alert.  Psychiatric:        Mood and Affect: Mood is not anxious. Affect is not blunt or tearful.        Behavior: Behavior is not slowed or withdrawn.        Thought Content: Thought content does not include suicidal ideation.     ED Results / Procedures / Treatments   Labs (all labs ordered are listed, but only abnormal results are displayed) Labs Reviewed - No data to display  EKG None  Radiology No results found.  Procedures Procedures (including critical care time)  Medications Ordered in ED Medications - No data to display  ED Course  I have reviewed the triage vital signs  and the nursing notes.  Pertinent labs & imaging results that were available during my care of the patient were reviewed by me and considered in my medical decision making (see chart for  details).    MDM Rules/Calculators/A&P                          I had extensive talk with patient.  I discussed that typically in the emergency department we are evaluating for acute psychiatric emergencies and that while I could consult psychiatry here, they may not place her on medicines necessarily versus determining whether she is safe for discharge.  She understands this and would prefer to do this as an outpatient, which I agree with as she is not acutely psychotic, suicidal, or an apparent threat to anyone.  She will be discharged to follow-up with the therapist and return precautions given. Final Clinical Impression(s) / ED Diagnoses Final diagnoses:  Post partum depression    Rx / DC Orders ED Discharge Orders    None       Pricilla Loveless, MD 12/11/20 1445

## 2020-12-11 NOTE — ED Triage Notes (Signed)
Pt presents with c/o depression. Pt reports she was being treated for PPD but her baby is now 67 months old so her OB said she is out of the window for her OB to continue treating her. Pt denies any SI, depression only.

## 2021-01-04 ENCOUNTER — Ambulatory Visit (INDEPENDENT_AMBULATORY_CARE_PROVIDER_SITE_OTHER): Payer: Medicaid Other

## 2021-01-04 ENCOUNTER — Encounter (HOSPITAL_COMMUNITY): Payer: Self-pay

## 2021-01-04 ENCOUNTER — Other Ambulatory Visit: Payer: Self-pay

## 2021-01-04 ENCOUNTER — Ambulatory Visit (HOSPITAL_COMMUNITY)
Admission: EM | Admit: 2021-01-04 | Discharge: 2021-01-04 | Disposition: A | Discharge status: Home / Self Care | Payer: Medicaid Other | Attending: Emergency Medicine | Admitting: Emergency Medicine

## 2021-01-04 DIAGNOSIS — M25512 Pain in left shoulder: Secondary | ICD-10-CM | POA: Diagnosis not present

## 2021-01-04 DIAGNOSIS — S4992XA Unspecified injury of left shoulder and upper arm, initial encounter: Secondary | ICD-10-CM | POA: Diagnosis not present

## 2021-01-04 DIAGNOSIS — Z760 Encounter for issue of repeat prescription: Secondary | ICD-10-CM

## 2021-01-04 MED ORDER — SERTRALINE HCL 50 MG PO TABS
50.0000 mg | ORAL_TABLET | Freq: Every day | ORAL | 0 refills | Status: DC
Start: 2021-01-04 — End: 2021-01-21

## 2021-01-04 MED ORDER — NAPROXEN 250 MG PO TABS: 250.0000 mg | ORAL_TABLET | Freq: Two times a day (BID) | ORAL | 0 refills | Status: DC

## 2021-01-04 MED ORDER — KETOROLAC TROMETHAMINE 60 MG/2ML IM SOLN
60.0000 mg | Freq: Once | INTRAMUSCULAR | Status: AC
  Administered 2021-01-04: 60 mg via INTRAMUSCULAR

## 2021-01-04 MED ORDER — KETOROLAC TROMETHAMINE 60 MG/2ML IM SOLN
INTRAMUSCULAR | Status: AC
  Filled 2021-01-04: qty 2
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
## 2021-01-21 ENCOUNTER — Ambulatory Visit (HOSPITAL_COMMUNITY)
Admission: EM | Admit: 2021-01-21 | Discharge: 2021-01-21 | Disposition: A | Discharge status: Home / Self Care | Payer: Medicaid Other | Attending: Urology | Admitting: Urology

## 2021-01-21 ENCOUNTER — Other Ambulatory Visit: Payer: Self-pay

## 2021-01-21 DIAGNOSIS — F32A Depression, unspecified: Secondary | ICD-10-CM

## 2021-01-21 DIAGNOSIS — F329 Major depressive disorder, single episode, unspecified: Secondary | ICD-10-CM | POA: Insufficient documentation

## 2021-01-21 MED ORDER — SERTRALINE HCL 50 MG PO TABS: 50.0000 mg | ORAL_TABLET | Freq: Every day | ORAL | 0 refills | Status: DC

## 2021-01-21 NOTE — Discharge Instructions (Addendum)
  Discharge recommendations:  Patient is to take medications as prescribed. Please see information for follow-up appointment with psychiatry and therapy. Please follow up with your primary care provider for all medical related needs.  Follow up with [facility] for Out patient therapy: hours Mon-Thurs 8am-11am, Friday 8am to 5pm   Therapy: We recommend that patient participate in individual therapy to address mental health concerns.  Medications: The parent/guardian is to contact a medical professional and/or outpatient provider to address any new side effects that develop. Parent/guardian should update outpatient providers of any new medications and/or medication changes.   Safety:  The patient should abstain from use of illicit substances/drugs and abuse of any medications. If symptoms worsen or do not continue to improve or if the patient becomes actively suicidal or homicidal then it is recommended that the patient return to the closest hospital emergency department, the [facility], or call 911 for further evaluation and treatment. National Suicide Prevention Lifeline 1-800-SUICIDE or 516-698-0357.

## 2021-01-21 NOTE — BH Assessment (Addendum)
TTS Triage:  Caitlin Grant presents to [facility] reporting that she needs refills of her Zoloft. Pt reports that her OBGYN was filling meds but pt d/c meds and OBGYN refused to refill at that point and recommended psychiatric support.   Pt currently denies SI, HI, AVH, or paranoid ideation.  Pt reports occasional substance use: will take xanax or percocet.   Pt denies that she is currently a danger to herself today.
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
## 2021-01-21 NOTE — ED Notes (Addendum)
Pt discharged in no acute distress, A&O x4, ambulatory. Denied SI/HI/AVH. Pt verbalized understanding of all AVS instructions reviewed by RN. Pt had no belongings in locker. Pt escorted to lobby by staff. Safety maintained.

## 2021-03-25 ENCOUNTER — Ambulatory Visit (HOSPITAL_COMMUNITY)
Admission: EM | Admit: 2021-03-25 | Discharge: 2021-03-25 | Disposition: A | Discharge status: Home / Self Care | Payer: Medicaid Other | Attending: Emergency Medicine | Admitting: Emergency Medicine

## 2021-03-25 ENCOUNTER — Encounter (HOSPITAL_COMMUNITY): Payer: Self-pay | Admitting: Emergency Medicine

## 2021-03-25 ENCOUNTER — Other Ambulatory Visit: Payer: Self-pay

## 2021-03-25 DIAGNOSIS — F32A Depression, unspecified: Secondary | ICD-10-CM | POA: Insufficient documentation

## 2021-03-25 DIAGNOSIS — Z760 Encounter for issue of repeat prescription: Secondary | ICD-10-CM | POA: Insufficient documentation

## 2021-03-25 DIAGNOSIS — B3749 Other urogenital candidiasis: Secondary | ICD-10-CM | POA: Insufficient documentation

## 2021-03-25 LAB — POCT URINALYSIS DIPSTICK, ED / UC
Bilirubin Urine: NEGATIVE
Glucose, UA: NEGATIVE mg/dL
Ketones, ur: NEGATIVE mg/dL
Leukocytes,Ua: NEGATIVE
Nitrite: NEGATIVE
Protein, ur: NEGATIVE mg/dL
Specific Gravity, Urine: 1.025 (ref 1.005–1.030)
Urobilinogen, UA: 0.2 mg/dL (ref 0.0–1.0)
pH: 5.5 (ref 5.0–8.0)

## 2021-03-25 MED ORDER — FLUCONAZOLE 150 MG PO TABS: 150.0000 mg | ORAL_TABLET | Freq: Every day | ORAL | 0 refills | Status: DC

## 2021-03-25 MED ORDER — SERTRALINE HCL 50 MG PO TABS: 50.0000 mg | ORAL_TABLET | Freq: Every day | ORAL | 0 refills | Status: DC

## 2021-03-25 NOTE — ED Triage Notes (Signed)
Symptoms for a least a month.  Complains of lower abdominal pain.  Denies back pain.  Patient had an irregular period that started last week and blood clots.  Denies burning with urination.  Denies frequent urination.  Patient has white, clumpy vaginal discharge with an odor.

## 2021-03-25 NOTE — Discharge Instructions (Signed)
I sent in a 30-day prescription of your Zoloft.  Follow-up with your primary care for additional refills.  Take 1 Diflucan today and another in 3 days for the yeast infection.  We will contact you if you also have bacteria and require any additional treatment. Make sure you are drinking plenty of fluids especially water.   Follow-up with your primary care provider as needed

## 2021-03-25 NOTE — ED Provider Notes (Signed)
[facility]    CSN: [id number] Arrival date & time: 03/25/21  1523      History   Chief Complaint Chief Complaint  Patient presents with  . Urinary Tract Infection    HPI Caitlin Grant is a 25 y.o. female.   Patient is here for evaluation of vaginal discharge, abdominal cramping, frequent urination, and irregular periods.  She reports symptoms have been ongoing for the past month.  Denies any dysuria or missed periods.  Has not taken any OTC medications or treatments. Denies any specific alleviating or aggravating factors.  Denies any fevers, chest pain, shortness of breath, N/V/D, numbness, tingling, weakness, abdominal pain, or headaches.   Also requesting refill of Zoloft prescription.  Reports being evaluated at behavioral health and prescribed Zoloft, but missed most recent appointment due to work schedule.  She reports running out of prescribed medication.  ROS: As per HPI, all other pertinent ROS negative   The history is provided by the patient.  Urinary Tract Infection Associated symptoms: vaginal discharge   Associated symptoms: no flank pain     Past Medical History:  Diagnosis Date  . Depression   . Ectopic pregnancy     Patient Active Problem List   Diagnosis Date Noted  . SVD (spontaneous vaginal delivery) 01/31/2020  . Obstetrical laceration 01/31/2020  . Labor and delivery indication for care or intervention 01/29/2020  . History of herpes genitalis 01/29/2020  . Depression 01/29/2020  . Premature rupture of membranes 01/28/2020    Past Surgical History:  Procedure Laterality Date  . TONSILLECTOMY    . WISDOM TOOTH EXTRACTION      OB History    Gravida  5   Para  1   Term  1   Preterm      AB  4   Living  1     SAB  4   IAB      Ectopic      Multiple  0   Live Births  1            Home Medications    Prior to Admission medications   Medication Sig Start Date End Date Taking? Authorizing Provider   cetirizine (ZYRTEC) 10 MG tablet Take 10 mg by mouth daily.   Yes [provider]  fluconazole (DIFLUCAN) 150 MG tablet Take 1 tablet (150 mg total) by mouth daily. Take one tablet now and one in 3 days if you are still having symptoms 03/25/21  Yes Ivette Loyal, NP  azelastine (OPTIVAR) 0.05 % ophthalmic solution Place 1 drop into both eyes 2 (two) times daily. 12/01/20   Particia Nearing, PA-C  erythromycin ophthalmic ointment Place 1 application into both eyes at bedtime. Place a 1/4 inch ribbon of ointment into the lower eyelid. 10/07/20   Eustace Moore, MD  loratadine (CLARITIN) 10 MG tablet Take 10 mg by mouth at bedtime.  11/08/19   [provider]  medroxyPROGESTERone (DEPO-PROVERA) 150 MG/ML injection Inject 150 mg into the muscle every 3 (three) months.    [provider]  metroNIDAZOLE (FLAGYL) 500 MG tablet Take 1 tablet (500 mg total) by mouth 2 (two) times daily. 12/01/20   Particia Nearing, PA-C  naproxen (NAPROSYN) 250 MG tablet Take 1 tablet (250 mg total) by mouth 2 (two) times daily with a meal. 01/04/21   Georgetta Haber, NP  phenazopyridine (PYRIDIUM) 200 MG tablet Take 1 tablet (200 mg total) by mouth 3 (three) times daily as  needed for pain. 03/28/20   Adam Phenix, MD  predniSONE (STERAPRED UNI-PAK 21 TAB) 10 MG (21) TBPK tablet Take by mouth daily. Take 6 tabs by mouth daily  for 2 days, then 5 tabs for 2 days, then 4 tabs for 2 days, then 3 tabs for 2 days, 2 tabs for 2 days, then 1 tab by mouth daily for 2 days 10/07/20   Eustace Moore, MD  Prenatal Vit-Fe Fumarate-FA (PRENATAL MULTIVITAMIN) TABS tablet Take 1 tablet by mouth at bedtime.    [provider]  sertraline (ZOLOFT) 50 MG tablet Take 1 tablet (50 mg total) by mouth daily. 03/25/21   Ivette Loyal, NP  triamcinolone cream (KENALOG) 0.1 % Apply 1 application topically 2 (two) times daily. 10/07/20   Eustace Moore, MD  valACYclovir (VALTREX) 500 MG  tablet Take 1 tablet (500 mg total) by mouth 2 (two) times daily. 10/07/20   Eustace Moore, MD  norethindrone (ORTHO MICRONOR) 0.35 MG tablet Take 1 tablet (0.35 mg total) by mouth daily. Patient not taking: Reported on 03/01/2020 01/31/20 04/27/20  Gerrit Heck, CNM    Family History Family History  Problem Relation Age of Onset  . Healthy Mother   . Healthy Father     Social History Social History   Tobacco Use  . Smoking status: Former Smoker    Packs/day: 0.50    Types: Cigarettes  . Smokeless tobacco: Never Used  . Tobacco comment: not while pregnant  Vaping Use  . Vaping Use: Every day  Substance Use Topics  . Alcohol use: Yes  . Drug use: Not Currently    Types: Marijuana    Comment: March 2020     Allergies   Penicillins and Latex   Review of Systems Review of Systems  Genitourinary: Positive for frequency, menstrual problem, pelvic pain and vaginal discharge. Negative for difficulty urinating, flank pain and hematuria.  All other systems reviewed and are negative.    Physical Exam Triage Vital Signs ED Triage Vitals  Enc Vitals Group     BP 03/25/21 1622 119/77     Pulse Rate 03/25/21 1622 98     Resp 03/25/21 1622 20     Temp 03/25/21 1622 98.8 F (37.1 C)     Temp Source 03/25/21 1622 Oral     SpO2 03/25/21 1622 100 %     Weight --      Height --      Head Circumference --      Peak Flow --      Pain Score 03/25/21 1617 6     Pain Loc --      Pain Edu? --      Excl. in GC? --    No data found.  Updated Vital Signs BP 119/77 (BP Location: Right Arm)   Pulse 98   Temp 98.8 F (37.1 C) (Oral)   Resp 20   SpO2 100%   Visual Acuity Right Eye Distance:   Left Eye Distance:   Bilateral Distance:    Right Eye Near:   Left Eye Near:    Bilateral Near:     Physical Exam Vitals and nursing note reviewed.  Constitutional:      General: She is not in acute distress.    Appearance: Normal appearance. She is not ill-appearing,  toxic-appearing or diaphoretic.  HENT:     Head: Normocephalic and atraumatic.  Eyes:     Conjunctiva/sclera: Conjunctivae normal.  Cardiovascular:  Rate and Rhythm: Normal rate.     Pulses: Normal pulses.  Pulmonary:     Effort: Pulmonary effort is normal.  Abdominal:     General: Abdomen is flat.  Genitourinary:    Comments: declines Musculoskeletal:        General: Normal range of motion.     Cervical back: Normal range of motion.  Skin:    General: Skin is warm and dry.  Neurological:     General: No focal deficit present.     Mental Status: She is alert and oriented to person, place, and time.  Psychiatric:        Mood and Affect: Mood normal.      UC Treatments / Results  Labs (all labs ordered are listed, but only abnormal results are displayed) Labs Reviewed  POCT URINALYSIS DIPSTICK, ED / UC - Abnormal; Notable for the following components:      Result Value   Hgb urine dipstick TRACE (*)    All other components within normal limits  URINE CULTURE  POC URINE PREG, ED  CERVICOVAGINAL ANCILLARY ONLY    EKG   Radiology No results found.  Procedures Procedures (including critical care time)  Medications Ordered in UC Medications - No data to display  Initial Impression / Assessment and Plan / UC Course  I have reviewed the triage vital signs and the nursing notes.  Pertinent labs & imaging results that were available during my care of the patient were reviewed by me and considered in my medical decision making (see chart for details).     Assessment negative for red flags or concerns.  Likely yeast infection based on described symptoms.  Diflucan prescribed.  Self swab obtained for BV and yeast, results pending.  Declines any STI testing at this time. Urinalysis negative for signs of infection.  Pregnancy test negative.  Urine culture pending we will treat based on results. Encourage fluids and rest.  Follow-up with primary care as  needed.  Zoloft prescription sent.  Patient verbalized understanding that she will need to follow-up with primary care for any further refills.  Final Clinical Impressions(s) / UC Diagnoses   Final diagnoses:  Candida UTI  Medication refill  Depression, unspecified depression type     Discharge Instructions     I sent in a 30-day prescription of your Zoloft.  Follow-up with your primary care for additional refills.  Take 1 Diflucan today and another in 3 days for the yeast infection.  We will contact you if you also have bacteria and require any additional treatment. Make sure you are drinking plenty of fluids especially water.   Follow-up with your primary care provider as needed    ED Prescriptions    Medication Sig Dispense Auth. Provider   sertraline (ZOLOFT) 50 MG tablet Take 1 tablet (50 mg total) by mouth daily. 30 tablet Ivette Loyal, NP   fluconazole (DIFLUCAN) 150 MG tablet Take 1 tablet (150 mg total) by mouth daily. Take one tablet now and one in 3 days if you are still having symptoms 2 tablet Ivette Loyal, NP     PDMP not reviewed this encounter.   Ivette Loyal, NP 03/25/21 1730

## 2021-03-26 LAB — CERVICOVAGINAL ANCILLARY ONLY
Bacterial Vaginitis (gardnerella): NEGATIVE
Candida Glabrata: NEGATIVE
Candida Vaginitis: NEGATIVE
Comment: NEGATIVE
Comment: NEGATIVE
Comment: NEGATIVE

## 2021-03-27 LAB — URINE CULTURE

## 2021-05-06 ENCOUNTER — Encounter (HOSPITAL_COMMUNITY): Payer: Self-pay | Admitting: Registered Nurse

## 2021-05-06 ENCOUNTER — Other Ambulatory Visit: Payer: Self-pay

## 2021-05-06 ENCOUNTER — Ambulatory Visit (HOSPITAL_COMMUNITY)
Admission: EM | Admit: 2021-05-06 | Discharge: 2021-05-06 | Disposition: A | Discharge status: Home / Self Care | Payer: Medicaid Other | Attending: Registered Nurse | Admitting: Registered Nurse

## 2021-05-06 DIAGNOSIS — Z760 Encounter for issue of repeat prescription: Secondary | ICD-10-CM | POA: Insufficient documentation

## 2021-05-06 DIAGNOSIS — Z79899 Other long term (current) drug therapy: Secondary | ICD-10-CM

## 2021-05-06 DIAGNOSIS — F331 Major depressive disorder, recurrent, moderate: Secondary | ICD-10-CM | POA: Insufficient documentation

## 2021-05-06 NOTE — ED Provider Notes (Signed)
[facility] Medical Screening Exam  Patient Name: Caitlin Grant MRN: [id number] Date of Evaluation: 05/06/21 Chief Complaint:   Diagnosis:  Final diagnoses:  MDD (major depressive disorder), recurrent episode, moderate (HCC)  Medication management    History of Present illness: Caitlin Grant is a 25 y.o. female patient presented to [facility] as a walk in requesting medication refill  Caitlin Grant, 25 y.o., female patient seen face to face by this provider, consulted with Dr. Earlene Plater; and chart reviewed on 05/06/21.  On evaluation Caitlin Grant reports she is currently taking Zoloft 50 mg but took her last pill today.  States medication is prescribed by her OB/GYN that started it while she was pregnant. Patient states prior to pregnancy she was taking Abilify and Lexapro that she feels worked better and would like to be restarted on those.  Discussed medications with patient and referral/resources for outpatient psychiatric services for medication management.  Patient states today was her first day to start work at [employer] and would not be able to do early walk in appointment; informed of Thursday appointment at [facility] hours of 1:00 PM to 4:00 PM states she should be able to make that.  Patient lives with her fiancee and son.  Patient instructed to call OB/GYN to get a refill on Zoloft until she is able to see a psychiatric provider.    During evaluation Caitlin Grant is up right in chair in no acute distress.  She is alert, oriented x 4, calm and cooperative.  Her mood is euthymic with congruent affect.  She does not appear to be responding to internal/external stimuli or delusional thoughts.  Patient denies suicidal/self-harm/homicidal ideation, psychosis, and paranoia.  Patient answered question appropriately.       Psychiatric Specialty Exam  Presentation  General Appearance:Appropriate for Environment; Casual  Eye  Contact:Good  Speech:Clear and Coherent; Normal Rate  Speech Volume:Normal  Handedness:Right   Mood and Affect  Mood:Euthymic  Affect:Appropriate; Congruent   Thought Process  Thought Processes:Coherent; Goal Directed  Descriptions of Associations:Intact  Orientation:Full (Time, Place and Person)  Thought Content:WDL    Hallucinations:None  Ideas of Reference:None  Suicidal Thoughts:No  Homicidal Thoughts:No   Sensorium  Memory:Immediate Good; Recent Good; Remote Good  Judgment:Intact  Insight:Present; Good   Executive Functions  Concentration:Good  Attention Span:Good  Recall:Good  Fund of Knowledge:Good  Language:Good   Psychomotor Activity  Psychomotor Activity:Normal   Assets  Assets:Communication Skills; Desire for Improvement; Financial Resources/Insurance; Housing; Resilience; Social Support; Transportation   Sleep  Sleep:Good  Number of hours: No data recorded  Nutritional Assessment (For OBS and [facility] admissions only) Has the patient had a weight loss or gain of 10 pounds or more in the last 3 months?: No Has the patient had a decrease in food intake/or appetite?: No Does the patient have dental problems?: No Does the patient have eating habits or behaviors that may be indicators of an eating disorder including binging or inducing vomiting?: No Has the patient recently lost weight without trying?: No Has the patient been eating poorly because of a decreased appetite?: No Malnutrition Screening Tool Score: 0    Physical Exam: Physical Exam Vitals and nursing note reviewed. Exam conducted with a chaperone present.  Constitutional:      General: She is not in acute distress.    Appearance: Normal appearance. She is not ill-appearing.  HENT:     Head: Normocephalic.  Eyes:     Pupils: Pupils are equal, round, and reactive to  light.  Cardiovascular:     Rate and Rhythm: Normal rate.  Pulmonary:     Effort: Pulmonary effort is  normal.  Musculoskeletal:        General: Normal range of motion.     Cervical back: Normal range of motion.  Skin:    General: Skin is warm and dry.  Neurological:     Mental Status: She is alert and oriented to person, place, and time.  Psychiatric:        Attention and Perception: Attention and perception normal. She does not perceive auditory or visual hallucinations.        Mood and Affect: Mood and affect normal.        Speech: Speech normal.        Behavior: Behavior is cooperative.        Thought Content: Thought content normal. Thought content is not paranoid or delusional. Thought content does not include homicidal or suicidal ideation.        Cognition and Memory: Cognition and memory normal.        Judgment: Judgment normal.    Review of Systems  Constitutional: Negative.   HENT: Negative.   Eyes: Negative.   Respiratory: Negative.   Cardiovascular: Negative.   Gastrointestinal: Negative.   Genitourinary: Negative.   Musculoskeletal: Negative.   Skin: Negative.   Neurological: Negative.   Endo/Heme/Allergies: Negative.   Psychiatric/Behavioral: Negative for memory loss. Depression: Stable. Hallucinations: Denies. Substance abuse: Denies. Suicidal ideas: Denies. The patient does not have insomnia. Nervous/anxious: Stable.        Patient requesting medication refill and outpatient psychiatric services for medication management.    Blood pressure 128/85, pulse (!) 105, temperature 98.4 F (36.9 C), temperature source Oral, SpO2 100 %, unknown if currently breastfeeding. There is no height or weight on file to calculate BMI.  Musculoskeletal: Strength & Muscle Tone: within normal limits Gait & Station: normal Patient leans: N/A   [facility] MSE Discharge Disposition for Follow up and Recommendations: Based on my evaluation the patient does not appear to have an emergency medical condition and can be discharged with resources and follow up care in outpatient services for  Medication Management and Individual Therapy    Follow-up Information    Go to  [facility].   Specialty: Urgent Care Why: Open Access:  Monday - Thursday from 8 am to 11 am for medication management and therapy intake.  On Friday from 1 pm to 4 pm for therapy intake only Contact information: [address] [place] [address] [phone]       Call  [facility], [facility].   Why: In-office and online appointments available Contact information: [address]  [address] [place] [address] [phone]               Discharge Instructions     Speak with your OB/Gyn and to give refill on your Zoloft until you are abel to be seen by a psychiatric provider.  A list of outpatient psychiatric providers are included with your discharge instructions.        Alvon Nygaard, NP 05/06/2021, 5:53 PM

## 2021-05-06 NOTE — ED Notes (Signed)
Pt discharged in no acute distress. Verbalized understanding of resources and recommendations provided by staff. Safety maintained.

## 2021-05-06 NOTE — BH Assessment (Signed)
Pt to [facility] for medication refill. Pt denies SI, HI, AVH. Pt is routine

## 2021-05-06 NOTE — Discharge Instructions (Addendum)
Speak with your OB/Gyn and to give refill on your Zoloft until you are abel to be seen by a psychiatric provider.  A list of outpatient psychiatric providers are included with your discharge instructions.

## 2021-05-09 ENCOUNTER — Telehealth (HOSPITAL_COMMUNITY): Payer: Self-pay | Admitting: Emergency Medicine

## 2021-05-09 NOTE — BH Assessment (Signed)
Speak with your OB/Gyn and to give refill on your Zoloft until you are abel to be seen by a psychiatric provider

## 2021-05-09 NOTE — BH Assessment (Signed)
Care Management - Follow Up [facility] Discharges   Writer attempted to make contact with patient today and was unsuccessful.  Writer was able to leave a HIPPA compliant voice message and will await callback.   

## 2021-05-10 ENCOUNTER — Other Ambulatory Visit: Payer: Self-pay

## 2021-05-10 ENCOUNTER — Encounter (HOSPITAL_COMMUNITY): Payer: Self-pay | Admitting: Physician Assistant

## 2021-05-10 ENCOUNTER — Ambulatory Visit (INDEPENDENT_AMBULATORY_CARE_PROVIDER_SITE_OTHER): Payer: Medicaid Other | Admitting: Physician Assistant

## 2021-05-10 VITALS — BP 115/70 | HR 77 | Temp 98.5°F | Ht 68.0 in | Wt 183.0 lb

## 2021-05-10 DIAGNOSIS — F411 Generalized anxiety disorder: Secondary | ICD-10-CM | POA: Diagnosis not present

## 2021-05-10 DIAGNOSIS — F331 Major depressive disorder, recurrent, moderate: Secondary | ICD-10-CM | POA: Diagnosis not present

## 2021-05-10 MED ORDER — FLUOXETINE HCL 20 MG PO CAPS: 20.0000 mg | ORAL_CAPSULE | Freq: Every day | ORAL | 1 refills | Status: DC

## 2021-05-10 MED ORDER — HYDROXYZINE HCL 10 MG PO TABS: 10.0000 mg | ORAL_TABLET | Freq: Three times a day (TID) | ORAL | 1 refills | Status: DC | PRN
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
## 2021-05-17 ENCOUNTER — Ambulatory Visit (HOSPITAL_COMMUNITY): Payer: Medicaid Other | Admitting: Licensed Clinical Social Worker

## 2021-06-19 ENCOUNTER — Encounter (HOSPITAL_COMMUNITY): Payer: Medicaid Other | Admitting: Physician Assistant

## 2021-07-15 ENCOUNTER — Other Ambulatory Visit: Payer: Self-pay

## 2021-07-15 ENCOUNTER — Telehealth (HOSPITAL_COMMUNITY): Payer: Medicaid Other | Admitting: Physician Assistant

## 2021-07-30 ENCOUNTER — Other Ambulatory Visit: Payer: Self-pay

## 2021-07-30 ENCOUNTER — Telehealth (HOSPITAL_COMMUNITY): Payer: Medicaid Other | Admitting: Physician Assistant

## 2021-09-02 ENCOUNTER — Inpatient Hospital Stay (HOSPITAL_COMMUNITY): Payer: Medicaid Other

## 2021-09-02 ENCOUNTER — Other Ambulatory Visit: Payer: Self-pay

## 2021-09-02 ENCOUNTER — Inpatient Hospital Stay (HOSPITAL_COMMUNITY)
Admission: AD | Admit: 2021-09-02 | Discharge: 2021-09-02 | Discharge status: Against Medical Advice | Payer: Medicaid Other | Attending: Obstetrics & Gynecology | Admitting: Obstetrics & Gynecology

## 2021-09-02 ENCOUNTER — Emergency Department (HOSPITAL_COMMUNITY)
Admission: EM | Admit: 2021-09-02 | Discharge: 2021-09-02 | Discharge status: Against Medical Advice | Payer: Medicaid Other

## 2021-09-02 ENCOUNTER — Inpatient Hospital Stay (EMERGENCY_DEPARTMENT_HOSPITAL)
Admission: AD | Admit: 2021-09-02 | Discharge: 2021-09-02 | Disposition: A | Discharge status: Home / Self Care | Payer: Medicaid Other | Source: Home / Self Care | Attending: Obstetrics & Gynecology | Admitting: Obstetrics & Gynecology

## 2021-09-02 ENCOUNTER — Encounter (HOSPITAL_COMMUNITY): Payer: Self-pay

## 2021-09-02 DIAGNOSIS — R102 Pelvic and perineal pain: Secondary | ICD-10-CM | POA: Insufficient documentation

## 2021-09-02 DIAGNOSIS — Z7952 Long term (current) use of systemic steroids: Secondary | ICD-10-CM | POA: Insufficient documentation

## 2021-09-02 DIAGNOSIS — O09291 Supervision of pregnancy with other poor reproductive or obstetric history, first trimester: Secondary | ICD-10-CM | POA: Insufficient documentation

## 2021-09-02 DIAGNOSIS — Z87891 Personal history of nicotine dependence: Secondary | ICD-10-CM | POA: Insufficient documentation

## 2021-09-02 DIAGNOSIS — Z79899 Other long term (current) drug therapy: Secondary | ICD-10-CM | POA: Insufficient documentation

## 2021-09-02 DIAGNOSIS — Z880 Allergy status to penicillin: Secondary | ICD-10-CM | POA: Insufficient documentation

## 2021-09-02 DIAGNOSIS — O26899 Other specified pregnancy related conditions, unspecified trimester: Secondary | ICD-10-CM

## 2021-09-02 DIAGNOSIS — Z3A08 8 weeks gestation of pregnancy: Secondary | ICD-10-CM

## 2021-09-02 DIAGNOSIS — Z793 Long term (current) use of hormonal contraceptives: Secondary | ICD-10-CM | POA: Insufficient documentation

## 2021-09-02 DIAGNOSIS — O26891 Other specified pregnancy related conditions, first trimester: Secondary | ICD-10-CM | POA: Insufficient documentation

## 2021-09-02 DIAGNOSIS — N898 Other specified noninflammatory disorders of vagina: Secondary | ICD-10-CM

## 2021-09-02 DIAGNOSIS — R109 Unspecified abdominal pain: Secondary | ICD-10-CM | POA: Insufficient documentation

## 2021-09-02 DIAGNOSIS — Z791 Long term (current) use of non-steroidal anti-inflammatories (NSAID): Secondary | ICD-10-CM | POA: Insufficient documentation

## 2021-09-02 DIAGNOSIS — Z3201 Encounter for pregnancy test, result positive: Secondary | ICD-10-CM | POA: Diagnosis not present

## 2021-09-02 DIAGNOSIS — Z3A01 Less than 8 weeks gestation of pregnancy: Secondary | ICD-10-CM | POA: Diagnosis not present

## 2021-09-02 LAB — CBC
HCT: 35.7 % — ABNORMAL LOW (ref 36.0–46.0)
Hemoglobin: 13.1 g/dL (ref 12.0–15.0)
MCH: 28.1 pg (ref 26.0–34.0)
MCHC: 36.7 g/dL — ABNORMAL HIGH (ref 30.0–36.0)
MCV: 76.4 fL — ABNORMAL LOW (ref 80.0–100.0)
Platelets: 359 10*3/uL (ref 150–400)
RBC: 4.67 MIL/uL (ref 3.87–5.11)
RDW: 12.9 % (ref 11.5–15.5)
WBC: 9.8 10*3/uL (ref 4.0–10.5)
nRBC: 0 % (ref 0.0–0.2)

## 2021-09-02 LAB — URINALYSIS, MICROSCOPIC (REFLEX)

## 2021-09-02 LAB — GC/CHLAMYDIA PROBE AMP (~~LOC~~) NOT AT ARMC
Chlamydia: NEGATIVE
Comment: NEGATIVE
Comment: NORMAL
Neisseria Gonorrhea: NEGATIVE

## 2021-09-02 LAB — WET PREP, GENITAL
Clue Cells Wet Prep HPF POC: NONE SEEN
Sperm: NONE SEEN
Trich, Wet Prep: NONE SEEN
Yeast Wet Prep HPF POC: NONE SEEN

## 2021-09-02 LAB — URINALYSIS, ROUTINE W REFLEX MICROSCOPIC
Bilirubin Urine: NEGATIVE
Glucose, UA: NEGATIVE mg/dL
Ketones, ur: NEGATIVE mg/dL
Leukocytes,Ua: NEGATIVE
Nitrite: NEGATIVE
Protein, ur: NEGATIVE mg/dL
Specific Gravity, Urine: >1.03 — ABNORMAL HIGH (ref 1.005–1.030)
pH: 6 (ref 5.0–8.0)

## 2021-09-02 LAB — HCG, QUANTITATIVE, PREGNANCY: hCG, Beta Chain, Quant, S: 140881 m[IU]/mL — ABNORMAL HIGH (ref <5)

## 2021-09-02 LAB — I-STAT BETA HCG BLOOD, ED (MC, WL, AP ONLY): I-stat hCG, quantitative: >2000 m[IU]/mL — ABNORMAL HIGH (ref <5)

## 2021-09-02 NOTE — ED Notes (Signed)
Report given to MAU 

## 2021-09-02 NOTE — MAU Note (Signed)
Yellow-green discharge. Pelvic pain. Unsure of amount of weeks pregnant.  Had to leave to take son home and has returned for ultrasound.

## 2021-09-02 NOTE — MAU Note (Addendum)
Having yellowish-green discharge for less then a week.  Period didn't come this month.  ' Need to find out how far along I am and what options are' LMP was end of July. No bleeding. Low pelvic pain/ pressure and if sit too long, bottom hurts.  Sometimes hurts to pee.

## 2021-09-02 NOTE — Progress Notes (Signed)
Pt brought her infant son with her to hospital.  Pt asked to use phone to call someone to pick up her son.  Reports she will have blood work done and will come back once she has child care.

## 2021-09-02 NOTE — MAU Provider Note (Signed)
History     CSN: [id number]  Arrival date and time: 09/02/21 0102   Event Date/Time   First Provider Initiated Contact with Patient 09/02/21 0231      Chief Complaint  Patient presents with   Possible Pregnancy   25 y.o. [id number] @[redacted]w[redacted]d  by LMP presenting pelvic pain and need to know how far she is. Describes as constant pressure. Rates pain 5/10. Hurts to sit at times. Endorses dysuria. No VB but reports yellow-green discharge x1 week.    OB History     Gravida  6   Para  1   Term  1   Preterm      AB  4   Living  1      SAB  4   IAB      Ectopic      Multiple  0   Live Births  1           Past Medical History:  Diagnosis Date   Depression    Ectopic pregnancy     Past Surgical History:  Procedure Laterality Date   TONSILLECTOMY     WISDOM TOOTH EXTRACTION      Family History  Problem Relation Age of Onset   Healthy Mother    Healthy Father     Social History   Tobacco Use   Smoking status: Former    Packs/day: 0.50    Types: Cigarettes   Smokeless tobacco: Never   Tobacco comments:    not while pregnant  Vaping Use   Vaping Use: Former  Substance Use Topics   Alcohol use: Not Currently   Drug use: Not Currently    Types: Marijuana    Comment: March 2020    Allergies:  Allergies  Allergen Reactions   Penicillins Shortness Of Breath and Rash    Did it involve swelling of the face/tongue/throat, SOB, or low BP? Yes Did it involve sudden or severe rash/hives, skin peeling, or any reaction on the inside of your mouth or nose? Unk Did you need to seek medical attention at a hospital or doctor's office? Yes When did it last happen? "I was a baby" If all above answers are "NO", may proceed with cephalosporin use.    Latex Rash    Rash where latex makes contact    Medications Prior to Admission  Medication Sig Dispense Refill Last Dose   azelastine (OPTIVAR) 0.05 % ophthalmic solution Place 1 drop into both eyes 2 (two) times  daily. 6 mL 2    cetirizine (ZYRTEC) 10 MG tablet Take 10 mg by mouth daily.      erythromycin ophthalmic ointment Place 1 application into both eyes at bedtime. Place a 1/4 inch ribbon of ointment into the lower eyelid. 1 g 0    fluconazole (DIFLUCAN) 150 MG tablet Take 1 tablet (150 mg total) by mouth daily. Take one tablet now and one in 3 days if you are still having symptoms 2 tablet 0    FLUoxetine (PROZAC) 20 MG capsule Take 1 capsule (20 mg total) by mouth daily. 30 capsule 1    hydrOXYzine (ATARAX/VISTARIL) 10 MG tablet Take 1 tablet (10 mg total) by mouth 3 (three) times daily as needed. 75 tablet 1    loratadine (CLARITIN) 10 MG tablet Take 10 mg by mouth at bedtime.       loratadine (CLARITIN) 10 MG tablet Take 10 mg by mouth daily.   09/01/2021   medroxyPROGESTERone (DEPO-PROVERA) 150 MG/ML injection Inject 150 mg  into the muscle every 3 (three) months.      metroNIDAZOLE (FLAGYL) 500 MG tablet Take 1 tablet (500 mg total) by mouth 2 (two) times daily. 14 tablet 0    naproxen (NAPROSYN) 250 MG tablet Take 1 tablet (250 mg total) by mouth 2 (two) times daily with a meal. 20 tablet 0    phenazopyridine (PYRIDIUM) 200 MG tablet Take 1 tablet (200 mg total) by mouth 3 (three) times daily as needed for pain. 12 tablet 0    predniSONE (STERAPRED UNI-PAK 21 TAB) 10 MG (21) TBPK tablet Take by mouth daily. Take 6 tabs by mouth daily  for 2 days, then 5 tabs for 2 days, then 4 tabs for 2 days, then 3 tabs for 2 days, 2 tabs for 2 days, then 1 tab by mouth daily for 2 days 42 tablet 0    Prenatal Vit-Fe Fumarate-FA (PRENATAL MULTIVITAMIN) TABS tablet Take 1 tablet by mouth at bedtime.      triamcinolone cream (KENALOG) 0.1 % Apply 1 application topically 2 (two) times daily. 30 g 0    valACYclovir (VALTREX) 500 MG tablet Take 1 tablet (500 mg total) by mouth 2 (two) times daily. 10 tablet 1     Review of Systems  Constitutional: Negative.   Genitourinary:  Positive for pelvic pain and vaginal  discharge. Negative for vaginal bleeding.  Physical Exam   Blood pressure 103/78, pulse 88, temperature 98.3 F (36.8 C), temperature source Oral, resp. rate 16, height 5' 8.5" (1.74 m), weight 85.3 kg, last menstrual period 07/14/2021, SpO2 100 %, unknown if currently breastfeeding.  Physical Exam Vitals and nursing note reviewed.  Constitutional:      Appearance: Normal appearance.  HENT:     Head: Normocephalic and atraumatic.  Cardiovascular:     Rate and Rhythm: Normal rate.  Pulmonary:     Effort: Pulmonary effort is normal. No respiratory distress.  Musculoskeletal:        General: Normal range of motion.     Cervical back: Normal range of motion.  Neurological:     General: No focal deficit present.     Mental Status: She is alert and oriented to person, place, and time.  Psychiatric:        Mood and Affect: Mood normal.        Behavior: Behavior normal.    MAU Course  Procedures  MDM Pt has her 28 month old with her and cannot arrange anyone to come and take the child home. I informed her that I cannot exclude an emergent condition with a partial evaluation therefore she is leaving AMA with intention to return to complete her evaluation.  Assessment and Plan  Positive pregnancy test Pelvic pain AMA  Donette Larry 09/02/2021, 5:00 AM

## 2021-09-02 NOTE — ED Notes (Signed)
Registration came back into triage stating patient and her son that she had just checked in did not want to wait and told registration they would go over to [facility] peds for her son and she would be seen there. Registration handed this RN patient's stickers.

## 2021-09-02 NOTE — MAU Note (Signed)
Labs and vaginal swabs collected per order. Pt signed AM consent - stated she had to leave to take her son home-she has no one to come and stay with him at the hospital or pick him up. Pt plans to return ASAP for additional evaluation.

## 2021-09-02 NOTE — ED Triage Notes (Signed)
Pt reports that she took home pregnancy test and wants to know how far along she is to determine if she wants to keep it.

## 2021-09-02 NOTE — MAU Provider Note (Addendum)
History     CSN: [id number]  Arrival date and time: 09/02/21 0411  Chief Complaint  Patient presents with   Abdominal Pain   25 y.o. [id number] @[redacted]w[redacted]d  by LMP presenting pelvic pain and need to know how far she is. Describes as constant pressure and cramping. Started 2 weeks ago. Rates pain 5/10. Has not treated it. Hurts to sit at times. No VB but reports yellow-green discharge x1 week. Denies itching but reports malodor.     OB History     Gravida  6   Para  1   Term  1   Preterm      AB  4   Living  1      SAB  4   IAB      Ectopic      Multiple  0   Live Births  1           Past Medical History:  Diagnosis Date   Depression    Ectopic pregnancy     Past Surgical History:  Procedure Laterality Date   TONSILLECTOMY     WISDOM TOOTH EXTRACTION      Family History  Problem Relation Age of Onset   Healthy Mother    Healthy Father     Social History   Tobacco Use   Smoking status: Former    Packs/day: 0.50    Types: Cigarettes   Smokeless tobacco: Never   Tobacco comments:    not while pregnant  Vaping Use   Vaping Use: Former  Substance Use Topics   Alcohol use: Not Currently   Drug use: Not Currently    Types: Marijuana    Comment: March 2020    Allergies:  Allergies  Allergen Reactions   Penicillins Shortness Of Breath and Rash    Did it involve swelling of the face/tongue/throat, SOB, or low BP? Yes Did it involve sudden or severe rash/hives, skin peeling, or any reaction on the inside of your mouth or nose? Unk Did you need to seek medical attention at a hospital or doctor's office? Yes When did it last happen? "I was a baby" If all above answers are "NO", may proceed with cephalosporin use.    Latex Rash    Rash where latex makes contact    Medications Prior to Admission  Medication Sig Dispense Refill Last Dose   azelastine (OPTIVAR) 0.05 % ophthalmic solution Place 1 drop into both eyes 2 (two) times daily. 6 mL 2     cetirizine (ZYRTEC) 10 MG tablet Take 10 mg by mouth daily.      erythromycin ophthalmic ointment Place 1 application into both eyes at bedtime. Place a 1/4 inch ribbon of ointment into the lower eyelid. 1 g 0    fluconazole (DIFLUCAN) 150 MG tablet Take 1 tablet (150 mg total) by mouth daily. Take one tablet now and one in 3 days if you are still having symptoms 2 tablet 0    FLUoxetine (PROZAC) 20 MG capsule Take 1 capsule (20 mg total) by mouth daily. 30 capsule 1    hydrOXYzine (ATARAX/VISTARIL) 10 MG tablet Take 1 tablet (10 mg total) by mouth 3 (three) times daily as needed. 75 tablet 1    loratadine (CLARITIN) 10 MG tablet Take 10 mg by mouth at bedtime.       loratadine (CLARITIN) 10 MG tablet Take 10 mg by mouth daily.      medroxyPROGESTERone (DEPO-PROVERA) 150 MG/ML injection Inject 150 mg into the muscle every  3 (three) months.      metroNIDAZOLE (FLAGYL) 500 MG tablet Take 1 tablet (500 mg total) by mouth 2 (two) times daily. 14 tablet 0    naproxen (NAPROSYN) 250 MG tablet Take 1 tablet (250 mg total) by mouth 2 (two) times daily with a meal. 20 tablet 0    phenazopyridine (PYRIDIUM) 200 MG tablet Take 1 tablet (200 mg total) by mouth 3 (three) times daily as needed for pain. 12 tablet 0    predniSONE (STERAPRED UNI-PAK 21 TAB) 10 MG (21) TBPK tablet Take by mouth daily. Take 6 tabs by mouth daily  for 2 days, then 5 tabs for 2 days, then 4 tabs for 2 days, then 3 tabs for 2 days, 2 tabs for 2 days, then 1 tab by mouth daily for 2 days 42 tablet 0    Prenatal Vit-Fe Fumarate-FA (PRENATAL MULTIVITAMIN) TABS tablet Take 1 tablet by mouth at bedtime.      triamcinolone cream (KENALOG) 0.1 % Apply 1 application topically 2 (two) times daily. 30 g 0    valACYclovir (VALTREX) 500 MG tablet Take 1 tablet (500 mg total) by mouth 2 (two) times daily. 10 tablet 1     Review of Systems  Constitutional:  Negative for fever.  Genitourinary:  Positive for pelvic pain and vaginal discharge.  Negative for vaginal bleeding.  Physical Exam   Blood pressure 116/67, pulse 88, temperature 98 F (36.7 C), temperature source Oral, resp. rate 16, last menstrual period 07/14/2021, SpO2 100 %, unknown if currently breastfeeding.  Physical Exam Vitals and nursing note reviewed.  Constitutional:      General: She is not in acute distress (appears comfortable).    Appearance: Normal appearance.  HENT:     Head: Normocephalic and atraumatic.  Cardiovascular:     Rate and Rhythm: Normal rate.  Pulmonary:     Effort: Pulmonary effort is normal. No respiratory distress.  Abdominal:     General: There is no distension.     Palpations: Abdomen is soft. There is no mass.     Tenderness: There is no abdominal tenderness. There is no guarding or rebound.     Hernia: No hernia is present.  Musculoskeletal:        General: Normal range of motion.     Cervical back: Normal range of motion.  Skin:    General: Skin is warm and dry.  Neurological:     General: No focal deficit present.     Mental Status: She is alert and oriented to person, place, and time.  Psychiatric:        Mood and Affect: Mood normal.        Behavior: Behavior normal.    Results for orders placed or performed during the hospital encounter of 09/02/21 (from the past 24 hour(s))  I-Stat Beta hCG blood, ED ([facility], [facility], [facility] only)     Status: Abnormal   Collection Time: 09/02/21  1:19 AM  Result Value Ref Range   I-stat hCG, quantitative >2,000.0 (H) <5 mIU/mL   Comment 3          Urinalysis, Routine w reflex microscopic Urine, Clean Catch     Status: Abnormal   Collection Time: 09/02/21  2:21 AM  Result Value Ref Range   Color, Urine YELLOW YELLOW   APPearance HAZY (A) CLEAR   Specific Gravity, Urine >1.030 (H) 1.005 - 1.030   pH 6.0 5.0 - 8.0   Glucose, UA NEGATIVE NEGATIVE mg/dL   Hgb  urine dipstick TRACE (A) NEGATIVE   Bilirubin Urine NEGATIVE NEGATIVE   Ketones, ur NEGATIVE NEGATIVE mg/dL   Protein, ur NEGATIVE  NEGATIVE mg/dL   Nitrite NEGATIVE NEGATIVE   Leukocytes,Ua NEGATIVE NEGATIVE  Urinalysis, Microscopic (reflex)     Status: Abnormal   Collection Time: 09/02/21  2:21 AM  Result Value Ref Range   RBC / HPF 0-5 0 - 5 RBC/hpf   WBC, UA 6-10 0 - 5 WBC/hpf   Bacteria, UA FEW (A) NONE SEEN   Squamous Epithelial / LPF 0-5 0 - 5   Mucus PRESENT   CBC     Status: Abnormal   Collection Time: 09/02/21  3:30 AM  Result Value Ref Range   WBC 9.8 4.0 - 10.5 K/uL   RBC 4.67 3.87 - 5.11 MIL/uL   Hemoglobin 13.1 12.0 - 15.0 g/dL   HCT 27.7 (L) 82.4 - 23.5 %   MCV 76.4 (L) 80.0 - 100.0 fL   MCH 28.1 26.0 - 34.0 pg   MCHC 36.7 (H) 30.0 - 36.0 g/dL   RDW 36.1 44.3 - 15.4 %   Platelets 359 150 - 400 K/uL   nRBC 0.0 0.0 - 0.2 %  hCG, quantitative, pregnancy     Status: Abnormal   Collection Time: 09/02/21  3:30 AM  Result Value Ref Range   hCG, Beta Chain, Quant, S 140,881 (H) <5 mIU/mL  Wet prep, genital     Status: Abnormal   Collection Time: 09/02/21  3:32 AM   Specimen: Vaginal  Result Value Ref Range   Yeast Wet Prep HPF POC NONE SEEN NONE SEEN   Trich, Wet Prep NONE SEEN NONE SEEN   Clue Cells Wet Prep HPF POC NONE SEEN NONE SEEN   WBC, Wet Prep HPF POC MANY (A) NONE SEEN   Sperm NONE SEEN    US OB Comp Less 14 Wks  Result Date: 09/02/2021 CLINICAL DATA:  Abdominal pain and first-trimester pregnancy EXAM: OBSTETRIC <14 WK ULTRASOUND TECHNIQUE: Transabdominal ultrasound was performed for evaluation of the gestation as well as the maternal uterus and adnexal regions. COMPARISON:  None of this gestation FINDINGS: Intrauterine gestational sac: Single Yolk sac:  Visualized. Embryo:  Visualized. Cardiac Activity: Visualized. Heart Rate: 170 bpm CRL:   20.5 mm   8 w 4 d                  [place] EDC: 04/10/2022 Subchorionic hemorrhage:  None visualized. Maternal uterus/adnexae: Normal appearance of the ovaries IMPRESSION: Single living intrauterine pregnancy measuring 8 weeks 4 days. No abnormal finding.  Electronically Signed   By: Tiburcio Pea M.D.   On: 09/02/2021 05:35    MAU Course  Procedures  MDM Labs and [place] ordered and reviewed. Viable IUP on [place]. Pt tearful, undecided if she will continue the pregnancy, doesn't think she can afford another child, does not want a picture. Emotional support given. Stable for discharge home.   Assessment and Plan   1. [redacted] weeks gestation of pregnancy   2. Abdominal pain affecting pregnancy    Discharge home Follow up with OB provider of choice SAB precautions  Allergies as of 09/02/2021       Reactions   Penicillins Shortness Of Breath, Rash   Did it involve swelling of the face/tongue/throat, SOB, or low BP? Yes Did it involve sudden or severe rash/hives, skin peeling, or any reaction on the inside of your mouth or nose? Unk Did you need to seek medical attention at a hospital  or doctor's office? Yes When did it last happen? "I was a baby" If all above answers are "NO", may proceed with cephalosporin use.   Latex Rash   Rash where latex makes contact        Medication List     STOP taking these medications    azelastine 0.05 % ophthalmic solution Commonly known as: OPTIVAR   cetirizine 10 MG tablet Commonly known as: ZYRTEC   erythromycin ophthalmic ointment   fluconazole 150 MG tablet Commonly known as: DIFLUCAN   loratadine 10 MG tablet Commonly known as: CLARITIN   medroxyPROGESTERone 150 MG/ML injection Commonly known as: DEPO-PROVERA   metroNIDAZOLE 500 MG tablet Commonly known as: FLAGYL   naproxen 250 MG tablet Commonly known as: Naprosyn   phenazopyridine 200 MG tablet Commonly known as: Pyridium   predniSONE 10 MG (21) Tbpk tablet Commonly known as: STERAPRED UNI-PAK 21 TAB   triamcinolone cream 0.1 % Commonly known as: KENALOG   valACYclovir 500 MG tablet Commonly known as: VALTREX       TAKE these medications    FLUoxetine 20 MG capsule Commonly known as: PROZAC Take 1 capsule (20 mg  total) by mouth daily.   hydrOXYzine 10 MG tablet Commonly known as: ATARAX/VISTARIL Take 1 tablet (10 mg total) by mouth 3 (three) times daily as needed.   prenatal multivitamin Tabs tablet Take 1 tablet by mouth at bedtime.       Donette Larry, CNM 09/02/2021, 6:10 AM

## 2021-09-02 NOTE — ED Provider Notes (Signed)
Emergency Medicine Provider OB Triage Evaluation Note  Caitlin Grant is a 25 y.o. female, [id number], at Unknown gestation who presents to the emergency department with complaints of "I need to know if I'm pregnant." LMP around the end of July/early August, but states that it didn't feel like her usual period. She is also endorsing bilateral lower abdominal pain and vaginal discharge. No fever, chills, vomiting, shortness of breath  Review of  Systems  Positive: vaginal discharge, amenorrhea, abdominal pain Negative: fever, chills, vomiting, shortness of breath  Physical Exam  BP 115/76   Pulse 98   Temp 98.8 F (37.1 C) (Oral)   Resp 18   SpO2 100%  General: Awake, no distress  HEENT: Atraumatic  Resp: Normal effort  Cardiac: Normal rate Abd: Nontender  MSK: Moves all extremities without difficulty Neuro: Speech clear  Medical Decision Making  Pt evaluated for pregnancy concern and is stable for transfer to MAU. Pt is in agreement with plan for transfer.  1:40 AM Discussed with MAU APP, Melonie, who accepts patient in transfer.  Clinical Impression   1. Positive pregnancy test   2. Vaginal discharge        Barkley Boards, PA-C 09/02/21 0140    Nira Conn, MD 09/02/21 [phone]

## 2021-10-28 DIAGNOSIS — Z3482 Encounter for supervision of other normal pregnancy, second trimester: Secondary | ICD-10-CM | POA: Diagnosis not present

## 2021-10-28 DIAGNOSIS — O99891 Other specified diseases and conditions complicating pregnancy: Secondary | ICD-10-CM | POA: Diagnosis not present

## 2021-10-28 DIAGNOSIS — Z0184 Encounter for antibody response examination: Secondary | ICD-10-CM | POA: Diagnosis not present

## 2021-10-28 DIAGNOSIS — O3680X0 Pregnancy with inconclusive fetal viability, not applicable or unspecified: Secondary | ICD-10-CM | POA: Diagnosis not present

## 2021-10-28 DIAGNOSIS — O0932 Supervision of pregnancy with insufficient antenatal care, second trimester: Secondary | ICD-10-CM | POA: Diagnosis not present

## 2021-10-28 DIAGNOSIS — Z0183 Encounter for blood typing: Secondary | ICD-10-CM | POA: Diagnosis not present

## 2021-10-28 DIAGNOSIS — Z365 Encounter for antenatal screening for isoimmunization: Secondary | ICD-10-CM | POA: Diagnosis not present

## 2021-10-28 DIAGNOSIS — O99342 Other mental disorders complicating pregnancy, second trimester: Secondary | ICD-10-CM | POA: Diagnosis not present

## 2021-10-28 DIAGNOSIS — Z23 Encounter for immunization: Secondary | ICD-10-CM | POA: Diagnosis not present

## 2021-10-28 DIAGNOSIS — Z113 Encounter for screening for infections with a predominantly sexual mode of transmission: Secondary | ICD-10-CM | POA: Diagnosis not present

## 2021-10-28 DIAGNOSIS — N898 Other specified noninflammatory disorders of vagina: Secondary | ICD-10-CM | POA: Diagnosis not present

## 2021-10-28 DIAGNOSIS — F32A Depression, unspecified: Secondary | ICD-10-CM | POA: Diagnosis not present

## 2021-10-28 DIAGNOSIS — B009 Herpesviral infection, unspecified: Secondary | ICD-10-CM | POA: Diagnosis not present

## 2021-10-28 DIAGNOSIS — Z114 Encounter for screening for human immunodeficiency virus [HIV]: Secondary | ICD-10-CM | POA: Diagnosis not present

## 2021-10-28 DIAGNOSIS — F419 Anxiety disorder, unspecified: Secondary | ICD-10-CM | POA: Diagnosis not present

## 2021-10-28 DIAGNOSIS — Z3A16 16 weeks gestation of pregnancy: Secondary | ICD-10-CM | POA: Diagnosis not present

## 2021-10-28 DIAGNOSIS — Z1159 Encounter for screening for other viral diseases: Secondary | ICD-10-CM | POA: Diagnosis not present

## 2021-10-28 DIAGNOSIS — Z3689 Encounter for other specified antenatal screening: Secondary | ICD-10-CM | POA: Diagnosis not present

## 2021-10-28 DIAGNOSIS — O98512 Other viral diseases complicating pregnancy, second trimester: Secondary | ICD-10-CM | POA: Diagnosis not present

## 2021-10-28 LAB — HEPATITIS C ANTIBODY: HCV Ab: NEGATIVE

## 2021-10-28 LAB — OB RESULTS CONSOLE RUBELLA ANTIBODY, IGM: Rubella: IMMUNE

## 2021-10-28 LAB — OB RESULTS CONSOLE HEPATITIS B SURFACE ANTIGEN: Hepatitis B Surface Ag: NEGATIVE

## 2021-11-29 DIAGNOSIS — Z363 Encounter for antenatal screening for malformations: Secondary | ICD-10-CM | POA: Diagnosis not present

## 2021-11-29 DIAGNOSIS — Z3A21 21 weeks gestation of pregnancy: Secondary | ICD-10-CM | POA: Diagnosis not present

## 2021-11-29 DIAGNOSIS — N898 Other specified noninflammatory disorders of vagina: Secondary | ICD-10-CM | POA: Diagnosis not present

## 2021-11-29 DIAGNOSIS — O26892 Other specified pregnancy related conditions, second trimester: Secondary | ICD-10-CM | POA: Diagnosis not present

## 2021-11-29 DIAGNOSIS — O0932 Supervision of pregnancy with insufficient antenatal care, second trimester: Secondary | ICD-10-CM | POA: Diagnosis not present

## 2021-11-29 DIAGNOSIS — O35BXX0 Maternal care for other (suspected) fetal abnormality and damage, fetal cardiac anomalies, not applicable or unspecified: Secondary | ICD-10-CM | POA: Diagnosis not present

## 2021-12-15 NOTE — L&D Delivery Note (Addendum)
OB/GYN Faculty Practice Delivery Note ? ?Caitlin Grant is a 26 y.o. [id number] s/p SVD at [redacted]w[redacted]d. She was admitted for SOL.  ? ?ROM: 0h 98m with mec stained fluid ?GBS Status: negative    ?Maximum Maternal Temperature: 98.2 ? ?Labor Progress: ?Initial SVE: Patient presented to MAU with consistent contractions she was 6.5/90.  She received an epidural as soon as she got to the L&D floor and she then progressed to complete. She still had an intact bag and pushed with bag intact for a bit and then opted for AROM.  ? ?Delivery Date/Time: [phone] ?Delivery: Called to room and patient was complete and pushing. Head delivered ROA. Cord wrapped around body twice and then across body. Shoulder and body delivered in usual fashion. Infant with spontaneous cry, placed on mother's abdomen, dried and stimulated. Cord clamped x 2 and cut by provider immediately after delivery due to large amount of meconium and baby taken over to warmer. Cord blood drawn. Cord gas was sent and pH found to be  7.26. Placenta delivered spontaneously with gentle cord traction. Fundus firm with massage and Pitocin. Labia, perineum, vagina, and cervix inspected with sterile lap, found to have no tears.  ?Baby Weight: pending ? ?Placenta: 3 vessel, intact. Sent to L&D ?Complications: None ?Lacerations: None ?EBL: 100 mL ?Analgesia: Epidural  ? ?Infant:  ?APGAR (1 MIN): 8   ?APGAR (5 MINS): 10   ? ?Caitlin Cipro, DO, PGY-1 ?04/07/2022, 9:08 AM ? ?GME ATTESTATION:  ?I saw and evaluated the patient. I agree with the findings and the plan of care as documented in the resident?s note and have made all necessary edits ? ?Caitlin Mccreedy, MD, MPH ?OB Fellow, Faculty Practice ?[place] ?04/07/2022 9:57 AM ? ? ? ? ?

## 2022-01-27 DIAGNOSIS — Z23 Encounter for immunization: Secondary | ICD-10-CM | POA: Diagnosis not present

## 2022-01-27 DIAGNOSIS — Z3689 Encounter for other specified antenatal screening: Secondary | ICD-10-CM | POA: Diagnosis not present

## 2022-01-27 LAB — OB RESULTS CONSOLE HIV ANTIBODY (ROUTINE TESTING): HIV: NONREACTIVE

## 2022-01-27 LAB — OB RESULTS CONSOLE RPR: RPR: NONREACTIVE

## 2022-01-28 DIAGNOSIS — Z3A28 28 weeks gestation of pregnancy: Secondary | ICD-10-CM | POA: Diagnosis not present

## 2022-01-28 DIAGNOSIS — M7989 Other specified soft tissue disorders: Secondary | ICD-10-CM | POA: Diagnosis not present

## 2022-01-28 DIAGNOSIS — Z3A29 29 weeks gestation of pregnancy: Secondary | ICD-10-CM | POA: Diagnosis not present

## 2022-01-28 DIAGNOSIS — O99810 Abnormal glucose complicating pregnancy: Secondary | ICD-10-CM | POA: Diagnosis not present

## 2022-02-05 DIAGNOSIS — O24410 Gestational diabetes mellitus in pregnancy, diet controlled: Secondary | ICD-10-CM | POA: Diagnosis not present

## 2022-02-05 DIAGNOSIS — Z3A31 31 weeks gestation of pregnancy: Secondary | ICD-10-CM | POA: Diagnosis not present

## 2022-02-05 DIAGNOSIS — O0993 Supervision of high risk pregnancy, unspecified, third trimester: Secondary | ICD-10-CM | POA: Diagnosis not present

## 2022-02-25 DIAGNOSIS — Z3A34 34 weeks gestation of pregnancy: Secondary | ICD-10-CM | POA: Diagnosis not present

## 2022-02-25 DIAGNOSIS — O24410 Gestational diabetes mellitus in pregnancy, diet controlled: Secondary | ICD-10-CM | POA: Diagnosis not present

## 2022-02-25 DIAGNOSIS — O0993 Supervision of high risk pregnancy, unspecified, third trimester: Secondary | ICD-10-CM | POA: Diagnosis not present

## 2022-02-26 DIAGNOSIS — N898 Other specified noninflammatory disorders of vagina: Secondary | ICD-10-CM | POA: Diagnosis not present

## 2022-03-12 DIAGNOSIS — O24410 Gestational diabetes mellitus in pregnancy, diet controlled: Secondary | ICD-10-CM | POA: Diagnosis not present

## 2022-03-12 DIAGNOSIS — Z3A36 36 weeks gestation of pregnancy: Secondary | ICD-10-CM | POA: Diagnosis not present

## 2022-03-12 DIAGNOSIS — Z3689 Encounter for other specified antenatal screening: Secondary | ICD-10-CM | POA: Diagnosis not present

## 2022-03-12 LAB — OB RESULTS CONSOLE GC/CHLAMYDIA
Chlamydia: NEGATIVE
Gonorrhea: NEGATIVE

## 2022-03-12 LAB — OB RESULTS CONSOLE GBS: GBS: NEGATIVE

## 2022-04-07 ENCOUNTER — Other Ambulatory Visit: Payer: Self-pay

## 2022-04-07 ENCOUNTER — Inpatient Hospital Stay (HOSPITAL_COMMUNITY): Payer: Medicaid Other | Admitting: Anesthesiology

## 2022-04-07 ENCOUNTER — Encounter (HOSPITAL_COMMUNITY): Payer: Self-pay | Admitting: Obstetrics & Gynecology

## 2022-04-07 ENCOUNTER — Inpatient Hospital Stay (HOSPITAL_COMMUNITY)
Admission: AD | Admit: 2022-04-07 | Discharge: 2022-04-08 | DRG: 806 | Disposition: A | Discharge status: Home / Self Care | Payer: Medicaid Other | Attending: Obstetrics and Gynecology | Admitting: Obstetrics and Gynecology

## 2022-04-07 DIAGNOSIS — Z3A39 39 weeks gestation of pregnancy: Secondary | ICD-10-CM | POA: Diagnosis not present

## 2022-04-07 DIAGNOSIS — O99344 Other mental disorders complicating childbirth: Secondary | ICD-10-CM | POA: Diagnosis not present

## 2022-04-07 DIAGNOSIS — F411 Generalized anxiety disorder: Secondary | ICD-10-CM | POA: Diagnosis not present

## 2022-04-07 DIAGNOSIS — O9832 Other infections with a predominantly sexual mode of transmission complicating childbirth: Secondary | ICD-10-CM | POA: Diagnosis not present

## 2022-04-07 DIAGNOSIS — F331 Major depressive disorder, recurrent, moderate: Secondary | ICD-10-CM | POA: Diagnosis present

## 2022-04-07 DIAGNOSIS — O24420 Gestational diabetes mellitus in childbirth, diet controlled: Principal | ICD-10-CM | POA: Diagnosis present

## 2022-04-07 DIAGNOSIS — F418 Other specified anxiety disorders: Secondary | ICD-10-CM | POA: Diagnosis not present

## 2022-04-07 DIAGNOSIS — Z880 Allergy status to penicillin: Secondary | ICD-10-CM

## 2022-04-07 DIAGNOSIS — Z87891 Personal history of nicotine dependence: Secondary | ICD-10-CM | POA: Diagnosis not present

## 2022-04-07 DIAGNOSIS — Z8619 Personal history of other infectious and parasitic diseases: Secondary | ICD-10-CM | POA: Diagnosis present

## 2022-04-07 DIAGNOSIS — O26893 Other specified pregnancy related conditions, third trimester: Secondary | ICD-10-CM | POA: Diagnosis not present

## 2022-04-07 LAB — TYPE AND SCREEN
ABO/RH(D): A POS
Antibody Screen: NEGATIVE

## 2022-04-07 LAB — CBC
HCT: 42.4 % (ref 36.0–46.0)
Hemoglobin: 14.3 g/dL (ref 12.0–15.0)
MCH: 27.6 pg (ref 26.0–34.0)
MCHC: 33.7 g/dL (ref 30.0–36.0)
MCV: 81.9 fL (ref 80.0–100.0)
Platelets: 229 10*3/uL (ref 150–400)
RBC: 5.18 MIL/uL — ABNORMAL HIGH (ref 3.87–5.11)
RDW: 14.4 % (ref 11.5–15.5)
WBC: 8.8 10*3/uL (ref 4.0–10.5)
nRBC: 0 % (ref 0.0–0.2)

## 2022-04-07 LAB — RPR: RPR Ser Ql: NONREACTIVE

## 2022-04-07 MED ORDER — PHENYLEPHRINE 80 MCG/ML (10ML) SYRINGE FOR IV PUSH (FOR BLOOD PRESSURE SUPPORT): 80.0000 ug | PREFILLED_SYRINGE | INTRAVENOUS | Status: DC | PRN

## 2022-04-07 MED ORDER — WITCH HAZEL-GLYCERIN EX PADS: 1.0000 "application " | MEDICATED_PAD | CUTANEOUS | Status: DC | PRN

## 2022-04-07 MED ORDER — OXYCODONE-ACETAMINOPHEN 5-325 MG PO TABS: 1.0000 | ORAL_TABLET | ORAL | Status: DC | PRN

## 2022-04-07 MED ORDER — FENTANYL-BUPIVACAINE-NACL 0.5-0.125-0.9 MG/250ML-% EP SOLN
EPIDURAL | Status: AC
  Filled 2022-04-07: qty 250

## 2022-04-07 MED ORDER — EPHEDRINE 5 MG/ML INJ: 10.0000 mg | INTRAVENOUS | Status: DC | PRN

## 2022-04-07 MED ORDER — IBUPROFEN 600 MG PO TABS
600.0000 mg | ORAL_TABLET | Freq: Four times a day (QID) | ORAL | Status: DC
  Administered 2022-04-07 – 2022-04-08 (×4): 600 mg via ORAL
  Filled 2022-04-07 (×4): qty 1

## 2022-04-07 MED ORDER — ACETAMINOPHEN 325 MG PO TABS
650.0000 mg | ORAL_TABLET | ORAL | Status: DC | PRN
Start: 2022-04-07 — End: 2022-04-07

## 2022-04-07 MED ORDER — LIDOCAINE HCL (PF) 1 % IJ SOLN
30.0000 mL | INTRAMUSCULAR | Status: DC | PRN
Start: 2022-04-07 — End: 2022-04-07

## 2022-04-07 MED ORDER — ONDANSETRON HCL 4 MG/2ML IJ SOLN: 4.0000 mg | INTRAMUSCULAR | Status: DC | PRN

## 2022-04-07 MED ORDER — FERROUS SULFATE 325 (65 FE) MG PO TABS
325.0000 mg | ORAL_TABLET | ORAL | Status: DC
  Administered 2022-04-08: 325 mg via ORAL
  Filled 2022-04-07: qty 1

## 2022-04-07 MED ORDER — FENTANYL-BUPIVACAINE-NACL 0.5-0.125-0.9 MG/250ML-% EP SOLN
12.0000 mL/h | EPIDURAL | Status: DC | PRN
  Administered 2022-04-07: 12 mL/h via EPIDURAL

## 2022-04-07 MED ORDER — OXYTOCIN BOLUS FROM INFUSION
333.0000 mL | Freq: Once | INTRAVENOUS | Status: AC
  Administered 2022-04-07: 333 mL via INTRAVENOUS

## 2022-04-07 MED ORDER — SENNOSIDES-DOCUSATE SODIUM 8.6-50 MG PO TABS
2.0000 | ORAL_TABLET | Freq: Every day | ORAL | Status: DC
  Administered 2022-04-08: 2 via ORAL
  Filled 2022-04-07: qty 2

## 2022-04-07 MED ORDER — DIPHENHYDRAMINE HCL 25 MG PO CAPS: 25.0000 mg | ORAL_CAPSULE | Freq: Four times a day (QID) | ORAL | Status: DC | PRN

## 2022-04-07 MED ORDER — LACTATED RINGERS IV SOLN
INTRAVENOUS | Status: DC
Start: 2022-04-07 — End: 2022-04-07

## 2022-04-07 MED ORDER — ACETAMINOPHEN 325 MG PO TABS: 650.0000 mg | ORAL_TABLET | ORAL | Status: DC | PRN

## 2022-04-07 MED ORDER — ONDANSETRON HCL 4 MG/2ML IJ SOLN: 4.0000 mg | Freq: Four times a day (QID) | INTRAMUSCULAR | Status: DC | PRN

## 2022-04-07 MED ORDER — COCONUT OIL OIL: 1.0000 "application " | TOPICAL_OIL | Status: DC | PRN

## 2022-04-07 MED ORDER — LACTATED RINGERS IV SOLN: 500.0000 mL | INTRAVENOUS | Status: DC | PRN

## 2022-04-07 MED ORDER — SIMETHICONE 80 MG PO CHEW: 80.0000 mg | CHEWABLE_TABLET | ORAL | Status: DC | PRN

## 2022-04-07 MED ORDER — BENZOCAINE-MENTHOL 20-0.5 % EX AERO
1.0000 "application " | INHALATION_SPRAY | CUTANEOUS | Status: DC | PRN
  Filled 2022-04-07: qty 56

## 2022-04-07 MED ORDER — LIDOCAINE HCL (PF) 1 % IJ SOLN
INTRAMUSCULAR | Status: DC | PRN
  Administered 2022-04-07 (×2): 5 mL via EPIDURAL

## 2022-04-07 MED ORDER — DIPHENHYDRAMINE HCL 50 MG/ML IJ SOLN: 12.5000 mg | INTRAMUSCULAR | Status: DC | PRN

## 2022-04-07 MED ORDER — SOD CITRATE-CITRIC ACID 500-334 MG/5ML PO SOLN: 30.0000 mL | ORAL | Status: DC | PRN

## 2022-04-07 MED ORDER — LACTATED RINGERS IV SOLN: 500.0000 mL | Freq: Once | INTRAVENOUS | Status: DC

## 2022-04-07 MED ORDER — PRENATAL MULTIVITAMIN CH
1.0000 | ORAL_TABLET | Freq: Every day | ORAL | Status: DC
  Administered 2022-04-07 – 2022-04-08 (×2): 1 via ORAL
  Filled 2022-04-07 (×2): qty 1

## 2022-04-07 MED ORDER — FLEET ENEMA 7-19 GM/118ML RE ENEM: 1.0000 | ENEMA | RECTAL | Status: DC | PRN

## 2022-04-07 MED ORDER — OXYTOCIN-SODIUM CHLORIDE 30-0.9 UT/500ML-% IV SOLN
2.5000 [IU]/h | INTRAVENOUS | Status: DC
Start: 2022-04-07 — End: 2022-04-07
  Administered 2022-04-07: 2.5 [IU]/h via INTRAVENOUS
  Filled 2022-04-07: qty 500

## 2022-04-07 MED ORDER — OXYCODONE-ACETAMINOPHEN 5-325 MG PO TABS: 2.0000 | ORAL_TABLET | ORAL | Status: DC | PRN

## 2022-04-07 MED ORDER — DIBUCAINE (PERIANAL) 1 % EX OINT: 1.0000 "application " | TOPICAL_OINTMENT | CUTANEOUS | Status: DC | PRN

## 2022-04-07 MED ORDER — ONDANSETRON HCL 4 MG PO TABS: 4.0000 mg | ORAL_TABLET | ORAL | Status: DC | PRN

## 2022-04-07 NOTE — Discharge Summary (Addendum)
? ?  Postpartum Discharge Summary ? ?   ?Patient Name: Caitlin Grant ?DOB: 06-13-1996 ?MRN: [id number] ? ?Date of admission: 04/07/2022 ?Delivery date:04/07/2022  ?Delivering provider: Renard Matter  ?Date of discharge: 04/08/2022 ? ?Admitting diagnosis: Indication for care in labor and delivery, antepartum [O75.9] ?Intrauterine pregnancy: [redacted]w[redacted]d    ?Secondary diagnosis:  Principal Problem: ?  Indication for care in labor and delivery, antepartum ?Active Problems: ?  History of herpes genitalis ?  SVD (spontaneous vaginal delivery) ? ?Additional problems: None    ?Discharge diagnosis: Term Pregnancy Delivered                                              ?Post partum procedures: None ?Augmentation: AROM ?Complications: None ? ?Hospital course: Onset of Labor With Vaginal Delivery      ?26y.o. yo [id number] admitted in Active Labor on 04/07/2022. Patient had an uncomplicated labor course as follows:  ?Membrane Rupture Time/Date: 8:03 AM ,04/07/2022   ?Delivery Method:Vaginal, Spontaneous  ?Episiotomy: None  ?Lacerations:  None  ?Patient had an uncomplicated postpartum course.  She is ambulating, tolerating a regular diet, passing flatus, and urinating well. Patient is discharged home in stable condition on 04/08/22. ? ?Newborn Data: ?Birth date:04/07/2022  ?Birth time:8:36 AM  ?Gender:Female  ?Living status:Living  ?Apgars:8 ,10  ?Weight:3360 g  ? ?Magnesium Sulfate received: No ?BMZ received: No ?Rhophylac:N/A ?MMR:N/A ?T-DaP:Given prenatally ?Flu: N/A ?Transfusion:No ? ?Physical exam  ?Vitals:  ? 04/07/22 1700 04/07/22 2045 04/07/22 2311 04/08/22 0556  ?BP: 129/84 131/72 134/83 116/69  ?Pulse: 75 80 91 72  ?Resp: '18 18 18 18  ' ?Temp: 98.2 ?F (36.8 ?C) 98.1 ?F (36.7 ?C) 98.7 ?F (37.1 ?C) 97.9 ?F (36.6 ?C)  ?TempSrc: Oral Oral Oral Oral  ?SpO2: 99% 100% 99% 99%  ?Weight:      ?Height:      ? ?General: alert, cooperative, and no distress ?Lochia: appropriate ?Uterine Fundus: firm ?Incision: N/A ?DVT Evaluation: No  evidence of DVT seen on physical exam. ?No cords or calf tenderness. ?No significant calf/ankle edema. ?Labs: ?Lab Results  ?Component Value Date  ? WBC 8.8 04/07/2022  ? HGB 14.3 04/07/2022  ? HCT 42.4 04/07/2022  ? MCV 81.9 04/07/2022  ? PLT 229 04/07/2022  ? ? ?  Latest Ref Rng & Units 04/27/2020  ?  3:44 AM  ?CMP  ?Glucose 70 - 99 mg/dL 103    ?BUN 6 - 20 mg/dL 10    ?Creatinine 0.44 - 1.00 mg/dL 0.70    ?Sodium 135 - 145 mmol/L 138    ?Potassium 3.5 - 5.1 mmol/L 3.8    ?Chloride 98 - 111 mmol/L 106    ?CO2 22 - 32 mmol/L 23    ?Calcium 8.9 - 10.3 mg/dL 9.1    ?Total Protein 6.5 - 8.1 g/dL 6.9    ?Total Bilirubin 0.3 - 1.2 mg/dL 0.8    ?Alkaline Phos 38 - 126 U/L 50    ?AST 15 - 41 U/L 16    ?ALT 0 - 44 U/L 15    ? ?Edinburgh Score: ? ?  04/07/2022  ? 12:00 PM  ?Edinburgh Postnatal Depression Scale Screening Tool  ?I have been able to laugh and see the funny side of things. 0  ?I have looked forward with enjoyment to things. 0  ?I have blamed myself unnecessarily when things went  wrong. 1  ?I have been anxious or worried for no good reason. 0  ?I have felt scared or panicky for no good reason. 0  ?Things have been getting on top of me. 0  ?I have been so unhappy that I have had difficulty sleeping. 0  ?I have felt sad or miserable. 0  ?I have been so unhappy that I have been crying. 0  ?The thought of harming myself has occurred to me. 0  ?Edinburgh Postnatal Depression Scale Total 1  ? ? ? ?After visit meds:  ?Allergies as of 04/08/2022   ? ?   Reactions  ? Penicillins Shortness Of Breath, Rash  ? Did it involve swelling of the face/tongue/throat, SOB, or low BP? Yes ?Did it involve sudden or severe rash/hives, skin peeling, or any reaction on the inside of your mouth or nose? Unk ?Did you need to seek medical attention at a hospital or doctor's office? Yes ?When did it last happen? "I was a baby" ?If all above answers are "NO", may proceed with cephalosporin use.  ? Latex Rash  ? Rash where latex makes contact   ? ?  ? ?  ?Medication List  ?  ? ?TAKE these medications   ? ?FLUoxetine 20 MG capsule ?Commonly known as: PROZAC ?Take 1 capsule (20 mg total) by mouth daily. ?  ?hydrOXYzine 10 MG tablet ?Commonly known as: ATARAX ?Take 1 tablet (10 mg total) by mouth 3 (three) times daily as needed. ?  ?prenatal multivitamin Tabs tablet ?Take 1 tablet by mouth at bedtime. ?  ? ?  ? ? ? ?Discharge home in stable condition ?Infant Feeding: Breast ?Infant Disposition:home with mother ?Discharge instruction: per After Visit Summary and Postpartum booklet. ?Activity: Advance as tolerated. Pelvic rest for 6 weeks.  ?Diet: routine diet ?Future Appointments:No future appointments. ?Follow up Visit: ?Patient to follow up at [facility] ? ?Please schedule this patient for a In person postpartum visit in 6 weeks with the following provider: Any provider. ?Additional Postpartum F/U: IUD   ?Low risk pregnancy  ?Delivery mode:  Vaginal, Spontaneous  ?Anticipated Birth Control:  IUD ? ? ?04/08/2022 ?Noralee Stain, DO, PGY-1 ? ? ?GME ATTESTATION:  ?I saw and evaluated the patient. I agree with the findings and the plan of care as documented in the resident?s note and made all necessary edits. ? ?Renard Matter, MD, MPH ?OB Fellow, Faculty Practice ?[place] for [facility] ?04/08/2022 9:46 AM ? ?

## 2022-04-07 NOTE — Lactation Note (Signed)
This note was copied from a baby's chart. ?Lactation Consultation Note ? ?Patient Name: Caitlin Grant ?Today's Date: 04/07/2022 ?Reason for consult: L&D Initial assessment;1st time breastfeeding;Term ?Age:26 years ? ?LC in to assist with first feeding within first hour of life.  P2 Mom of term baby doing STS and baby showing feeding cues.   ? ?Mom appeared anxious saying she wasn't able to breastfeed her first child and how she was told she needed a nipple shield.  Mom moving her body around and showing signs of increased anxiety.  Baby fussing and bobbing her head around looking for the breast.   ? ?LC recommended she try to latch baby first before trying with a nipple shield.  She was repeatedly saying she needed a nipple shield with first baby and never got one.  Mom feels that her nipples are "flat" and there is nothing for the baby to latch onto.   ? ?LC assisted by reviewing hand expression.  Glistening of colostrum noted.  Baby opening her mouth and Mom needing assistance in positioning and support of breast.  Initially attempted in laid back, but then elevated back of Mom's bed.  Baby noted to be losing suction intermittently while feeding in this position. Tried cross cradle and then football hold.  Mom anxious that she is unable to support her breast because she is "right handed".   Mom called her private duty IBCLC and face timed her.   ? ?Because Mom was insistent, LC assisted Mom in applying the 24 mm nipple shield.  24 mm was a little large.  IBCLC on phone requested that LC go and retrieve a 20 mm.  Talked to Mom about letting in-person LC assist her with latching.  Mom wanting to pull breast away from baby's nose and baby repeatedly lost latch.  Encouraged Mom to do some deep breathing to relax. ? ?While LC sandwiched the areola, baby able to attain a deep latch to breast and baby relaxed her body and sucked with jaw extensions.  Mom felt a nice tug.  As LC was leaving the room, baby popped off  and Mom was able to re-latch.  Praised Mom and reassured her that she has what baby needs.  If nipple shield is needed, Mom will be set up with a pump, Mom is aware of this.  ? ?Encouraged STS with baby as much as possible. ? ?Maternal Data ?Has patient been taught Hand Expression?: Yes ?Does the patient have breastfeeding experience prior to this delivery?: Yes ?How long did the patient breastfeed?: few days ? ?Feeding ?Mother's Current Feeding Choice: Breast Milk ? ?LATCH Score ?Latch: Repeated attempts needed to sustain latch, nipple held in mouth throughout feeding, stimulation needed to elicit sucking reflex. ? ?Audible Swallowing: None ? ?Type of Nipple: Everted at rest and after stimulation (short nipple shaft and compressible areola) ? ?Comfort (Breast/Nipple): Soft / non-tender ? ?Hold (Positioning): Assistance needed to correctly position infant at breast and maintain latch. ? ?LATCH Score: 6 ? ? ?Interventions ?Interventions: Breast feeding basics reviewed;Assisted with latch;Skin to skin;Breast massage;Breast compression;Adjust position;Support pillows;Position options ? ? ?Consult Status ?Consult Status: Follow-up from L&D ?Date: 04/07/22 ?Follow-up type: In-patient ? ? ? ?Johny Blamer E ?04/07/2022, 9:45 AM ? ? ? ?

## 2022-04-07 NOTE — Lactation Note (Signed)
This note was copied from a baby's chart. ?Lactation Consultation Note ?Mom upset waiting on LC but LC busy. ?Mom didn't want it to reflect on her that the baby hasn't fed that she has tried and baby wouldn't latch and she has given colostrum in spoon. Praised mom for that. ?LC apologized for not being able to come sooner. LC assisted in trying to latch baby but baby not real interested in latching. Would latch then let go. Mom re quested formula. Baby doesn't like the nipple or formula. Baby only took 7 ml. ?Explained to mom that babies sometimes aren't real hungry before 24 hrs but since mom had GDM the baby needs something so her glucose doesn't drop. ?Mom said that she used hand pump and the colostrum was really thick but mom wanted to pump. ?LC asked mom if she would like to use DEBP mom stated yes. ?Mom encouraged to feed baby 8-12 times/24 hours and with feeding cues.  Mom shown how to use DEBP & how to disassemble, clean, & reassemble parts.  ?Newborn feeding habits discussed. ?Encouraged mom to call for assistance as needed. ? ?Patient Name: Caitlin Grant ?Today's Date: 04/07/2022 ?Reason for consult: Mother's request;Term ?Age:27 hours ? ?Maternal Data ?Has patient been taught Hand Expression?: Yes ? ?Feeding ?Nipple Type: Slow - flow ? ?LATCH Score ?Latch: Repeated attempts needed to sustain latch, nipple held in mouth throughout feeding, stimulation needed to elicit sucking reflex. ? ?Audible Swallowing: None ? ?Type of Nipple: Everted at rest and after stimulation ? ?Comfort (Breast/Nipple): Soft / non-tender ? ?Hold (Positioning): Assistance needed to correctly position infant at breast and maintain latch. ? ?LATCH Score: 6 ? ? ?Lactation Tools Discussed/Used ?Tools: Pump ?Breast pump type: Double-Electric Breast Pump ?Pump Education: Setup, frequency, and cleaning;Milk Storage ?Reason for Pumping: mom wants to pump ?Pumping frequency: prn ? ?Interventions ?Interventions: Breast feeding basics  reviewed;Assisted with latch;Skin to skin;Breast massage;Adjust position;Support pillows;Position options;DEBP ? ?Discharge ?  ? ?Consult Status ?Consult Status: Follow-up ?Date: 04/08/22 ?Follow-up type: In-patient ? ? ? ?Charyl Dancer ?04/07/2022, 10:46 PM ? ? ? ?

## 2022-04-07 NOTE — Anesthesia Preprocedure Evaluation (Addendum)
Anesthesia Evaluation  ?Patient identified by MRN, date of birth, ID band ?Patient awake ? ? ? ?Reviewed: ?Allergy & Precautions, Patient's Chart, lab work & pertinent test results ? ?Airway ?Mallampati: II ? ?TM Distance: >3 FB ?Neck ROM: Full ? ? ? Dental ?no notable dental hx. ?(+) Teeth Intact ?  ?Pulmonary ?former smoker,  ?  ?Pulmonary exam normal ?breath sounds clear to auscultation ? ? ? ? ? ? Cardiovascular ?negative cardio ROS ?Normal cardiovascular exam ?Rhythm:Regular Rate:Normal ? ? ?  ?Neuro/Psych ?PSYCHIATRIC DISORDERS Anxiety Depression negative neurological ROS ?   ? GI/Hepatic ?Neg liver ROS, GERD  ,  ?Endo/Other  ?Obesity ? Renal/GU ?negative Renal ROS  ?negative genitourinary ?  ?Musculoskeletal ?negative musculoskeletal ROS ?(+)  ? Abdominal ?(+) + obese,   ?Peds ? Hematology ?negative hematology ROS ?(+)   ?Anesthesia Other Findings ? ? 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 ?  ? ? ? ? ? ? ? ? ? ? ? ? ? ?  ?  ? ? ? ? ? ? ? ?Anesthesia Physical ?Anesthesia Plan ? ?ASA: 2 ? ?Anesthesia Plan: Epidural  ? ?Post-op Pain Management:   ? ?Induction:  ? ?PONV Risk Score and Plan:  ? ?Airway Management Planned: Natural Airway ? ?Additional Equipment:  ? ?Intra-op Plan:  ? ?Post-operative Plan:  ? ?Informed Consent: I have reviewed the patients History and Physical, chart, labs and discussed the procedure including the risks, benefits and alternatives for the proposed anesthesia with the patient or authorized representative who has indicated his/her understanding and acceptance.  ? ? ? ? ? ?Plan Discussed with: Anesthesiologist ? ?Anesthesia Plan Comments:   ? ? ? ? ? ? ?Anesthesia Quick Evaluation ? ?

## 2022-04-07 NOTE — Plan of Care (Signed)
Pt demonstrated understanding 

## 2022-04-07 NOTE — MAU Note (Signed)
..  Caitlin Grant is a 26 y.o. at [redacted]w[redacted]d here in MAU reporting: CTX 3 to 5 mins, starting since 2230 last night with increased intensity. Pt denies VB, LOF, abnormal discharge, DFM, and PIH s/s. Last intercourse two days ago.  ?HSV on valtrex  ?Gbs Neg ?GDM diet controlled  ? ?Onset of complaint: 2230 ?Pain score: 9/10 ?Vitals:  ? 04/07/22 0556  ?BP: 117/74  ?Pulse: 84  ?   ?FHT:150 ?Lab orders placed from triage:   ? ?

## 2022-04-07 NOTE — Progress Notes (Signed)
Pt informed that the ultrasound is considered a limited OB ultrasound and is not intended to be a complete ultrasound exam.  Patient also informed that the ultrasound is not being completed with the intent of assessing for fetal or placental anomalies or any pelvic abnormalities.  Explained that the purpose of today's ultrasound is to assess for  presentation.  Patient acknowledges the purpose of the exam and the limitations of the study.    Cephalic  Hartley Wyke, NP   

## 2022-04-07 NOTE — H&P (Addendum)
OBSTETRIC ADMISSION HISTORY AND PHYSICAL ? ?Elvis CoilCharianna Grant is a 26 y.o. female [phone] with IUP at 17110w4d by 8w US presenting for SOL.  Reports consistent contractions since 2230 last night. She reports +FMs, No LOF, no VB, no blurry vision, headaches or peripheral edema, and RUQ pain.  She plans on breast feeding. She is unsure about birth control right now, had bad bleeding on Depo  ?She [facility] care at  [facility]   ? ?Dating: By Aviva Signs8w US --->  Estimated Date of Delivery: 04/10/22 ? ?Sono:   ? ?@[redacted]w[redacted]d , Normal anatomy, Cephalic presentation, Anterior placental lie EFW 54% ? ?Prenatal History/Complications:  ?--GDM, diet controlled  ? ?Past Medical History: ?Past Medical History:  ?Diagnosis Date  ? Depression   ? Ectopic pregnancy   ? ? ?Past Surgical History: ?Past Surgical History:  ?Procedure Laterality Date  ? TONSILLECTOMY    ? WISDOM TOOTH EXTRACTION    ? ? ?Obstetrical History: ?OB History   ? ? Gravida  ?6  ? Para  ?1  ? Term  ?1  ? Preterm  ?   ? AB  ?4  ? Living  ?1  ?  ? ? SAB  ?4  ? IAB  ?   ? Ectopic  ?   ? Multiple  ?0  ? Live Births  ?1  ?   ?  ?  ? ? ?Social History ?Social History  ? ?Socioeconomic History  ? Marital status: Single  ?  Spouse name: Not on file  ? Number of children: Not on file  ? Years of education: Not on file  ? Highest education level: Not on file  ?Occupational History  ? Not on file  ?Tobacco Use  ? Smoking status: Former  ?  Packs/day: 0.50  ?  Types: Cigarettes  ? Smokeless tobacco: Never  ? Tobacco comments:  ?  not while pregnant  ?Vaping Use  ? Vaping Use: Former  ?Substance and Sexual Activity  ? Alcohol use: Not Currently  ? Drug use: Not Currently  ?  Types: Marijuana  ?  Comment: March 2020  ? Sexual activity: Not Currently  ?  Birth control/protection: None  ?Other Topics Concern  ? Not on file  ?Social History Narrative  ? Not on file  ? ?Social Determinants of Health  ? ?Financial Resource Strain: Not on file  ?Food Insecurity: Not on file   ?Transportation Needs: Not on file  ?Physical Activity: Not on file  ?Stress: Not on file  ?Social Connections: Not on file  ? ? ?Family History: ?Family History  ?Problem Relation Age of Onset  ? Healthy Mother   ? Healthy Father   ? ? ?Allergies: ?Allergies  ?Allergen Reactions  ? Penicillins Shortness Of Breath and Rash  ?  Did it involve swelling of the face/tongue/throat, SOB, or low BP? Yes ?Did it involve sudden or severe rash/hives, skin peeling, or any reaction on the inside of your mouth or nose? Unk ?Did you need to seek medical attention at a hospital or doctor's office? Yes ?When did it last happen? "I was a baby" ?If all above answers are "NO", may proceed with cephalosporin use. ?  ? Latex Rash  ?  Rash where latex makes contact  ? ? ?Medications Prior to Admission  ?Medication Sig Dispense Refill Last Dose  ? Prenatal Vit-Fe Fumarate-FA (PRENATAL MULTIVITAMIN) TABS tablet Take 1 tablet by mouth at bedtime.   Past Week  ? FLUoxetine (PROZAC) 20 MG capsule Take  1 capsule (20 mg total) by mouth daily. 30 capsule 1 More than a month  ? hydrOXYzine (ATARAX/VISTARIL) 10 MG tablet Take 1 tablet (10 mg total) by mouth 3 (three) times daily as needed. 75 tablet 1 More than a month  ? ? ? ?Review of Systems  ? ?All systems reviewed and negative except as stated in HPI ? ?Blood pressure 120/69, pulse 80, temperature 97.8 ?F (36.6 ?C), temperature source Oral, resp. rate 16, height 5\' 8"  (1.727 m), weight 95.3 kg, last menstrual period 07/14/2021, SpO2 100 %, unknown if currently breastfeeding. ?General appearance: alert, cooperative, and no distress ?Lungs: clear to auscultation bilaterally ?Heart: regular rate and rhythm ?Abdomen: soft, non-tender; bowel sounds normal ?Pelvic: normal external genitalia  ?Extremities: Homans sign is negative, no sign of DVT ?Presentation: cephalic ?Fetal monitoringBaseline: 145 bpm, Variability: Good {> 6 bpm), and Accelerations: Reactive ?Uterine activityFrequency: Every 2  minutes ? ?Dilation: 10 ?Effacement (%): 100 ?Station: Plus 2 ?Exam by:: [email or web address] NP ? ? ?Prenatal labs: ?ABO, Rh: --/--/A POS (04/24 11-04-2006) ?Antibody: NEG (04/24 11-04-2006) ?Rubella:   Immune  ?RPR: Nonreactive (02/13 0000) Nonreactive ?HBsAg: Negative (11/14 0000) Nonreactive  ?HIV: Non-reactive (02/13 0000) Non-reactive ?GBS: Negative/-- (03/29 0000) Negative ?1 hr Glucola 154, abnormal ?Genetic screening  unknown ?Anatomy 04-24-1986 normal  ? ?Prenatal Transfer Tool  ?Maternal Diabetes: Yes:  Diabetes Type:  Diet controlled ?Genetic Screening: Declined ?Maternal Ultrasounds/Referrals: Normal ?Fetal Ultrasounds or other Referrals:  None ?Maternal Substance Abuse:  No ?Significant Maternal Medications:  None ?Significant Maternal Lab Results: Group B Strep negative ? ?Results for orders placed or performed during the hospital encounter of 04/07/22 (from the past 24 hour(s))  ?CBC  ? Collection Time: 04/07/22  6:20 AM  ?Result Value Ref Range  ? WBC 8.8 4.0 - 10.5 K/uL  ? RBC 5.18 (H) 3.87 - 5.11 MIL/uL  ? Hemoglobin 14.3 12.0 - 15.0 g/dL  ? HCT 42.4 36.0 - 46.0 %  ? MCV 81.9 80.0 - 100.0 fL  ? MCH 27.6 26.0 - 34.0 pg  ? MCHC 33.7 30.0 - 36.0 g/dL  ? RDW 14.4 11.5 - 15.5 %  ? Platelets 229 150 - 400 K/uL  ? nRBC 0.0 0.0 - 0.2 %  ?Type and screen [facility]  ? Collection Time: 04/07/22  6:47 AM  ?Result Value Ref Range  ? ABO/RH(D) A POS   ? Antibody Screen NEG   ? Sample Expiration    ?  04/10/2022,2359 ?Performed at [facility], [address] [place], [place] ?  ? ? ?Patient Active Problem List  ? Diagnosis Date Noted  ? Indication for care in labor and delivery, antepartum 04/07/2022  ? Generalized anxiety disorder 05/10/2021  ? Moderate episode of recurrent major depressive disorder (HCC) 05/06/2021  ? Medication management 05/06/2021  ? SVD (spontaneous vaginal delivery) 01/31/2020  ? Obstetrical laceration 01/31/2020  ? Labor and delivery indication for care or intervention  01/29/2020  ? History of herpes genitalis 01/29/2020  ? Depression 01/29/2020  ? Premature rupture of membranes 01/28/2020  ? ? ?Assessment/Plan:  ?Pier Laux is a 26 y.o. [phone] at [redacted]w[redacted]d here for SOL ? ?#Labor:Patient was admitted in active labor, expectant management, delivered shortly after getting to the floor ?#Pain: epidural ?#FWB: Cat 1  ?#ID:  GBS negative (per patient's phone Mychart - confirmed with her at bedside when patient showed results to team) ?#MOF: Breast ?#MOC:Unsure ?#Circ:  N/A ? ?#History of HSV ?Last outbreak 2 months ago. Currently  taking Valtrex. No current lesions. ? ?Moshe Cipro, MD, PGY-1 ?04/07/2022, 10:02 AM ? ? GME ATTESTATION:  ?I saw and evaluated the patient. I agree with the findings and the plan of care as documented in the resident?s note and have made all necessary edits. ? ?Warner Mccreedy, MD, MPH ?OB Fellow, Faculty Practice ?[place], [facility] ?04/07/2022 10:03 AM ? ?

## 2022-04-07 NOTE — Anesthesia Postprocedure Evaluation (Signed)
Anesthesia Post Note ? ?Patient: Tricha Ruggirello ? ?Procedure(s) Performed: AN AD HOC LABOR EPIDURAL ? ?  ? ?Patient location during evaluation: Mother Baby ?Anesthesia Type: Epidural ?Level of consciousness: awake and alert ?Pain management: pain level controlled ?Vital Signs Assessment: post-procedure vital signs reviewed and stable ?Respiratory status: spontaneous breathing, nonlabored ventilation and respiratory function stable ?Cardiovascular status: stable ?Postop Assessment: no headache, no backache, epidural receding, patient able to bend at knees, no apparent nausea or vomiting, able to ambulate and adequate PO intake ?Anesthetic complications: no ? ? ?No notable events documented. ? ?Last Vitals:  ?Vitals:  ? 04/07/22 1200 04/07/22 1700  ?BP: 117/69 129/84  ?Pulse: 80 75  ?Resp: 18 18  ?Temp: 36.7 ?C 36.8 ?C  ?SpO2: 100% 99%  ?  ?Last Pain:  ?Vitals:  ? 04/07/22 1920  ?TempSrc:   ?PainSc: 0-No pain  ? ?Pain Goal:   ? ?  ?  ?  ?  ?  ?  ?  ? ?Ulice Bold Whittley Carandang ? ? ? ? ?

## 2022-04-07 NOTE — Progress Notes (Signed)
Epidural removed with tip intact.

## 2022-04-07 NOTE — Progress Notes (Signed)
Upon getting up to the restroom patient stated she had some sharp pains under right breast. Oxygen saturations 100 percent once we got to the bed and patient stated she felt fine that the pain went away. I told her to let me know if the sharp pains came back.  ?

## 2022-04-07 NOTE — Lactation Note (Signed)
This note was copied from a baby's chart. ?Lactation Consultation Note ? ?Patient Name: Caitlin Grant ?Today's Date: 04/07/2022 ?Reason for consult: Initial assessment;Mother's request;Difficult latch;1st time breastfeeding;Term;Breastfeeding assistance ?Age:27 hours ? ?Infant latched for 10 min with signs of milk transfer.  ?Plan 1. To feed based on cues 8-12x 24hr period. Mom to offer breast with compression and note signs of milk transfer.  ?2. If unable to latch, Mom to offer EBM on spoon 5-7 ml per feeding.  ?3. I and O sheet reviewed.  ?All questions answered at the end of the visit.  ? ?Maternal Data ?Has patient been taught Hand Expression?: Yes ? ?Feeding ?Mother's Current Feeding Choice: Breast Milk ? ?LATCH Score ?Latch: Repeated attempts needed to sustain latch, nipple held in mouth throughout feeding, stimulation needed to elicit sucking reflex. ? ?Audible Swallowing: Spontaneous and intermittent ? ?Type of Nipple: Everted at rest and after stimulation ? ?Comfort (Breast/Nipple): Soft / non-tender ? ?Hold (Positioning): Assistance needed to correctly position infant at breast and maintain latch. ? ?LATCH Score: 8 ? ? ?Lactation Tools Discussed/Used ?  ? ?Interventions ?Interventions: Breast feeding basics reviewed;Assisted with latch;Skin to skin;Breast massage;Hand express;Breast compression;Adjust position;Support pillows;Position options;Expressed milk;DEBP;Education;LC Services brochure;Infant Driven Feeding Algorithm education ? ?Discharge ?Pump: Personal ?WIC Program: No ? ?Consult Status ?Consult Status: Follow-up ?Date: 04/08/22 ?Follow-up type: In-patient ? ? ? ?Caitlin Fessenden  Grant ?04/07/2022, 4:24 PM ? ? ? ?

## 2022-04-07 NOTE — Anesthesia Procedure Notes (Addendum)
Epidural ?Patient location during procedure: OB ?Start time: 04/07/2022 7:07 AM ?End time: 04/07/2022 7:17 AM ? ?Staffing ?Anesthesiologist: Josephine Igo, MD ?Performed: anesthesiologist  ? ?Preanesthetic Checklist ?Completed: patient identified, IV checked, site marked, risks and benefits discussed, surgical consent, monitors and equipment checked, pre-op evaluation and timeout performed ? ?Epidural ?Patient position: sitting ?Prep: DuraPrep and site prepped and draped ?Patient monitoring: continuous pulse ox and blood pressure ?Approach: midline ?Location: L4-L5 ?Injection technique: LOR saline ? ?Needle:  ?Needle type: Tuohy  ?Needle gauge: 17 G ?Needle length: 9 cm and 9 ?Needle insertion depth: 6 cm ?Catheter type: closed end flexible ?Catheter size: 19 Gauge ?Catheter at skin depth: 11 cm ?Test dose: negative and Other ? ?Assessment ?Events: blood not aspirated, injection not painful, no injection resistance, no paresthesia and negative IV test ? ?Additional Notes ?Patient identified. Risks and benefits discussed including failed block, incomplete  ?Pain control, post dural puncture headache, nerve damage, paralysis, blood pressure ?Changes, nausea, vomiting, reactions to medications-both toxic and allergic and post ?Partum back pain. All questions were answered. Patient expressed understanding and wished to proceed. Sterile technique was used throughout procedure. Epidural site was ?Dressed with sterile barrier dressing. No paresthesias, signs of intravascular injection ?Or signs of intrathecal spread were encountered.  ?Patient was more comfortable after the epidural was dosed. ?Please see RN's note for documentation of vital signs and FHR which are stable. ? ?Reason for block:procedure for pain ? ? ? ?

## 2022-04-08 MED ORDER — IBUPROFEN 600 MG PO TABS: 600.0000 mg | ORAL_TABLET | Freq: Four times a day (QID) | ORAL | 0 refills | Status: DC | PRN

## 2022-04-08 MED ORDER — ACETAMINOPHEN 500 MG PO TABS
1000.0000 mg | ORAL_TABLET | Freq: Three times a day (TID) | ORAL | 0 refills | Status: DC | PRN
Start: 2022-04-08 — End: 2024-10-26

## 2022-04-08 NOTE — Lactation Note (Addendum)
This note was copied from a baby's chart. ?Lactation Consultation Note ? ?Patient Name: Caitlin Grant ?Today's Date: 04/08/2022 ?  ?Age:27 hours ? ?LC attempted to see mom, but social worker was in the room.  ? ?Will follow up prior to discharge. ? ?Maternal Data ?  ? ?Feeding ?  ? ?LATCH Score ?  ? ?  ? ?  ? ?  ? ?  ? ?  ? ? ?Lactation Tools Discussed/Used ?  ? ?Interventions ?  ? ?Discharge ?  ? ?Consult Status ?  ? ? ? ?Alira Fretwell P [place], IBCLC ?04/08/2022, 10:14 AM ? ? ? ?

## 2022-04-08 NOTE — Lactation Note (Addendum)
This note was copied from a baby's chart. ?Lactation Consultation Note ? ?Patient Name: Caitlin Grant ?Today's Date: 04/08/2022 ?Reason for consult: Follow-up assessment;Term;Nipple pain/trauma;Breastfeeding assistance ?Age:27 hours ? ?Mom dressing baby for discharge. Baby crying. LC offered to change baby while mom got dressed.  ? ?Mom says she needs help with latching and changing baby because her nails are too long and she's having them removed today.  ? ?Mom states that baby took 66mL of formula. Baby cueing. LC assisted mom with latching baby on left breast in cradle position. Mom states that the latch feels a little better, but it is still painful. LC assisted mom with latching baby on right breast in the football position.  ? ?LC placed a warm towel on moms breast while feeding and encouraged her to do breast compressions to keep baby going.  ? ?LC attempted to hand express colostrum, but there was some edema in the areola, so no drops were noted.  ? ?LC encouraged mom to use reverse pressure to soften the tissue. LC gave mom breast shells to help reduce the edema. ? ?LC reviewed information on engorgement and cluster feeding with mom.  ? ?Mom states that she will see her aunt who is an IBCLC for assistance when she leaves the hospital.  ? ?Mom encouraged to keep latching baby and give baby formula if baby appears to be hungry after the feeding and latch baby according to feeding cues. If supplementing by 2 days mom will give baby 61mL of formula. ?  ? ?Feeding ?Mother's Current Feeding Choice: Breast Milk ?Nipple Type: Slow - flow ? ?LATCH Score ?Latch: Repeated attempts needed to sustain latch, nipple held in mouth throughout feeding, stimulation needed to elicit sucking reflex. ? ?Audible Swallowing: Spontaneous and intermittent ? ?Type of Nipple: Everted at rest and after stimulation ? ?Comfort (Breast/Nipple): Filling, red/small blisters or bruises, mild/mod discomfort ? ?Hold (Positioning):  Assistance needed to correctly position infant at breast and maintain latch. ? ?LATCH Score: 7 ? ? ?Lactation Tools Discussed/Used ?  ? ?Interventions ?Interventions: Breast feeding basics reviewed;Assisted with latch;Breast massage;Hand express;Reverse pressure;Breast compression;Support pillows;Adjust position;Education ? ?Discharge ?Discharge Education: Engorgement and breast care;Warning signs for feeding baby ?Pump: Personal;DEBP (Motif) ? ?Consult Status ?Consult Status: Complete ?Date: 04/08/22 ? ? ? ?Angelyna Henderson P [place], IBCLC ?04/08/2022, 11:39 AM ? ? ? ?

## 2022-04-08 NOTE — Social Work (Signed)
CSW acknowledged consult and completed a clinical assessment.  There are no barriers to d/c. ? ?Clinical assessment notes will be entered at a later time. ? ?Vivi Barrack, MSW, LCSW ?[facility]  ?Clinical Social Worker  ?[phone] ?04/08/2022  10:40 AM  ?

## 2022-04-08 NOTE — Discharge Summary (Signed)
? ?  Postpartum Discharge Summary ? ? ?   ?Patient Name: Caitlin Grant ?DOB: 10-27-1996 ?MRN: [id number] ? ?Date of admission: 04/07/2022 ?Delivery date:04/07/2022  ?Delivering provider: Renard Matter  ?Date of discharge: 04/08/2022 ? ?Admitting diagnosis: Indication for care in labor and delivery, antepartum [O75.9] ?Intrauterine pregnancy: [redacted]w[redacted]d    ?Secondary diagnosis:  Principal Problem: ?  Indication for care in labor and delivery, antepartum ?Active Problems: ?  History of herpes genitalis ?  SVD (spontaneous vaginal delivery) ? ?Additional problems: None    ?Discharge diagnosis: Term Pregnancy Delivered                                              ?Post partum procedures: None ?Augmentation: AROM ?Complications: None ? ?Hospital course: Onset of Labor With Vaginal Delivery      ?26y.o. yo [id number] 335w4das admitted in Active Labor on 04/07/2022. Patient had an uncomplicated labor course as follows:  ?Membrane Rupture Time/Date: 8:03 AM ,04/07/2022   ?Delivery Method:Vaginal, Spontaneous  ?Episiotomy: None  ?Lacerations:  None  ?Patient had an uncomplicated postpartum course.  She is ambulating, tolerating a regular diet, passing flatus, and urinating well. Patient is discharged home in stable condition on 04/08/22. ? ?Newborn Data: ?Birth date:04/07/2022  ?Birth time:8:36 AM  ?Gender:Female  ?Living status:Living  ?Apgars:8 ,10  ?Weight:3360 g  ? ?Magnesium Sulfate received: No ?BMZ received: No ?Rhophylac:N/A ?MMR:No ?T-DaP: offered post partum ?Flu: No ?Transfusion:No ? ?Physical exam  ?Vitals:  ? 04/07/22 1700 04/07/22 2045 04/07/22 2311 04/08/22 0556  ?BP: 129/84 131/72 134/83 116/69  ?Pulse: 75 80 91 72  ?Resp: '18 18 18 18  ' ?Temp: 98.2 ?F (36.8 ?C) 98.1 ?F (36.7 ?C) 98.7 ?F (37.1 ?C) 97.9 ?F (36.6 ?C)  ?TempSrc: Oral Oral Oral Oral  ?SpO2: 99% 100% 99% 99%  ?Weight:      ?Height:      ? ?General: alert ?Lochia: appropriate ?Uterine Fundus: firm ?Incision: N/A ?DVT Evaluation: No evidence of DVT seen on  physical exam. ?Labs: ?Lab Results  ?Component Value Date  ? WBC 8.8 04/07/2022  ? HGB 14.3 04/07/2022  ? HCT 42.4 04/07/2022  ? MCV 81.9 04/07/2022  ? PLT 229 04/07/2022  ? ? ?  Latest Ref Rng & Units 04/27/2020  ?  3:44 AM  ?CMP  ?Glucose 70 - 99 mg/dL 103    ?BUN 6 - 20 mg/dL 10    ?Creatinine 0.44 - 1.00 mg/dL 0.70    ?Sodium 135 - 145 mmol/L 138    ?Potassium 3.5 - 5.1 mmol/L 3.8    ?Chloride 98 - 111 mmol/L 106    ?CO2 22 - 32 mmol/L 23    ?Calcium 8.9 - 10.3 mg/dL 9.1    ?Total Protein 6.5 - 8.1 g/dL 6.9    ?Total Bilirubin 0.3 - 1.2 mg/dL 0.8    ?Alkaline Phos 38 - 126 U/L 50    ?AST 15 - 41 U/L 16    ?ALT 0 - 44 U/L 15    ? ?Edinburgh Score: ? ?  04/07/2022  ? 12:00 PM  ?Edinburgh Postnatal Depression Scale Screening Tool  ?I have been able to laugh and see the funny side of things. 0  ?I have looked forward with enjoyment to things. 0  ?I have blamed myself unnecessarily when things went wrong. 1  ?I have been anxious or worried for  no good reason. 0  ?I have felt scared or panicky for no good reason. 0  ?Things have been getting on top of me. 0  ?I have been so unhappy that I have had difficulty sleeping. 0  ?I have felt sad or miserable. 0  ?I have been so unhappy that I have been crying. 0  ?The thought of harming myself has occurred to me. 0  ?Edinburgh Postnatal Depression Scale Total 1  ? ? ? ?After visit meds:  ?Allergies as of 04/08/2022   ? ?   Reactions  ? Penicillins Shortness Of Breath, Rash  ? Did it involve swelling of the face/tongue/throat, SOB, or low BP? Yes ?Did it involve sudden or severe rash/hives, skin peeling, or any reaction on the inside of your mouth or nose? Unk ?Did you need to seek medical attention at a hospital or doctor's office? Yes ?When did it last happen? "I was a baby" ?If all above answers are "NO", may proceed with cephalosporin use.  ? Latex Rash  ? Rash where latex makes contact  ? ?  ? ?  ?Medication List  ?  ? ?TAKE these medications   ? ?FLUoxetine 20 MG  capsule ?Commonly known as: PROZAC ?Take 1 capsule (20 mg total) by mouth daily. ?  ?hydrOXYzine 10 MG tablet ?Commonly known as: ATARAX ?Take 1 tablet (10 mg total) by mouth 3 (three) times daily as needed. ?  ?prenatal multivitamin Tabs tablet ?Take 1 tablet by mouth at bedtime. ?  ? ?  ? ? ? ?Discharge home in stable condition ?Infant Feeding: Breast ?Infant Disposition:home with mother ?Discharge instruction: per After Visit Summary and Postpartum booklet. ?Activity: Advance as tolerated. Pelvic rest for 6 weeks.  ?Diet: routine diet ?Future Appointments:No future appointments. ?Follow up Visit: ?Message sent to [facility] by Dr. Cy Blamer on 4/25 ? ?Please schedule this patient for a In person postpartum visit in 4 weeks with the following provider: Any provider (Dr. Cy Blamer if possible) ?Additional Postpartum F/U:Postpartum Depression checkup  ?Low risk pregnancy complicated by:  None ?Delivery mode:  Vaginal, Spontaneous  ?Anticipated Birth Control:   plans hormonal IUD ? ?Renard Matter, MD, MPH ?OB Fellow, Faculty Practice ? ? ? ?
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
## 2022-04-08 NOTE — BH Specialist Note (Signed)
Pt did not arrive to video visit and did not answer the phone; Unable to leave voice message; left MyChart message for patient.   

## 2022-04-15 ENCOUNTER — Telehealth (HOSPITAL_COMMUNITY): Payer: Self-pay | Admitting: *Deleted

## 2022-04-15 NOTE — Telephone Encounter (Signed)
Attempted Hospital Discharge Follow-up Call.  No answer.  Unable to leave a message. 

## 2022-04-17 ENCOUNTER — Ambulatory Visit: Payer: Medicaid Other | Admitting: Clinical

## 2022-04-17 DIAGNOSIS — Z91199 Patient's noncompliance with other medical treatment and regimen due to unspecified reason: Secondary | ICD-10-CM

## 2022-05-14 ENCOUNTER — Ambulatory Visit (INDEPENDENT_AMBULATORY_CARE_PROVIDER_SITE_OTHER): Payer: Medicaid Other | Admitting: Certified Nurse Midwife

## 2022-05-14 NOTE — Progress Notes (Signed)
No show

## 2022-05-14 NOTE — Progress Notes (Deleted)
No show

## 2022-05-14 NOTE — Addendum Note (Signed)
Addended by: Edd Arbour on: 05/14/2022 10:55 AM   Modules accepted: Level of Service

## 2022-06-23 ENCOUNTER — Encounter (HOSPITAL_COMMUNITY): Payer: Self-pay | Admitting: *Deleted

## 2022-06-23 ENCOUNTER — Other Ambulatory Visit: Payer: Self-pay

## 2022-06-23 ENCOUNTER — Ambulatory Visit (HOSPITAL_COMMUNITY)
Admission: EM | Admit: 2022-06-23 | Discharge: 2022-06-23 | Disposition: A | Discharge status: Home / Self Care | Payer: Medicaid Other | Attending: Internal Medicine | Admitting: Internal Medicine

## 2022-06-23 DIAGNOSIS — Z8659 Personal history of other mental and behavioral disorders: Secondary | ICD-10-CM | POA: Insufficient documentation

## 2022-06-23 DIAGNOSIS — N898 Other specified noninflammatory disorders of vagina: Secondary | ICD-10-CM | POA: Insufficient documentation

## 2022-06-23 DIAGNOSIS — R350 Frequency of micturition: Secondary | ICD-10-CM | POA: Insufficient documentation

## 2022-06-23 LAB — POC URINE PREG, ED: Preg Test, Ur: NEGATIVE

## 2022-06-23 LAB — POCT URINALYSIS DIPSTICK, ED / UC
Bilirubin Urine: NEGATIVE
Glucose, UA: NEGATIVE mg/dL
Ketones, ur: NEGATIVE mg/dL
Nitrite: NEGATIVE
Protein, ur: NEGATIVE mg/dL
Specific Gravity, Urine: 1.02 (ref 1.005–1.030)
Urobilinogen, UA: 0.2 mg/dL (ref 0.0–1.0)
pH: 6.5 (ref 5.0–8.0)

## 2022-06-23 MED ORDER — SERTRALINE HCL 50 MG PO TABS: 50.0000 mg | ORAL_TABLET | Freq: Every day | ORAL | 1 refills | Status: DC

## 2022-06-23 NOTE — Discharge Instructions (Signed)
Your urine did not show significant evidence of urinary tract infection.  There were some white blood cells but I assume this is more related to vaginal discharge.  We will send this off for culture but defer antibiotics for the time being.  We will contact you if your swab is abnormal.  Wear loosefitting cotton underwear and use several drinks soaps and detergents.  If you develop any additional symptoms please return for reevaluation.  I have called in a refill of your Zoloft 50 mg.  Please establish with primary care provider for further evaluation and management.  If you develop any thoughts of suicide/self-harm or wanting to harm someone else you need to go to the emergency room immediately as we discussed.  Do not take NSAIDs including aspirin, ibuprofen/Advil, naproxen/Aleve regularly with this medication as it can cause bleeding.

## 2022-06-23 NOTE — ED Triage Notes (Signed)
Pt reports green vag discharge . Pt wants std check. Pt needs a refill on Zoloft.

## 2022-06-23 NOTE — ED Provider Notes (Signed)
[facility]    CSN: [id number] Arrival date & time: 06/23/22  1847      History   Chief Complaint Chief Complaint  Patient presents with   SEXUALLY TRANSMITTED DISEASE   UTI   Vaginal Discharge    HPI Caitlin Grant is a 26 y.o. female.   Patient presents today with several concerns.  Her primary concern today is ongoing vaginal discharge.  Reports this has been intermittent for the past several months and is often a limegreen color.  She denies any associated odor.  She has been seen by her OB/GYN and has tested negative.  She reports that symptoms have worsened since having her child approximately 3 months ago.  She is confident that she is not pregnant and is not breast-feeding.  Denies any recent antibiotic use or changes to personal hygiene products including soaps or detergents.  Denies abdominal pain, pelvic pain, fever, nausea, vomiting.  She does report some urinary symptoms including frequency and urgency.  Denies any significant dysuria.  She denies recent urogenital procedure, self catheterization, single kidney.  Denies history of nephrolithiasis.  In addition, patient is requesting a refill of her Zoloft.  This had been filled by her OB/GYN but she is no longer in their care.  Reports that she has been on 50 mg for a long period of time.  This was momentarily decreased to 25 mg during her pregnancy but she feels she would benefit from a slightly higher dose.  She denies current or previous thoughts of suicide/self-harm.  Denies hospitalization due to mental illness.  Denies any side effects reports she has done well on this medication.  She is in the process of establishing with a primary care provider but does not yet have someone and is running low on her SSRI.    Past Medical History:  Diagnosis Date   Depression    Ectopic pregnancy     Patient Active Problem List   Diagnosis Date Noted   Indication for care in labor and delivery, antepartum  04/07/2022   Generalized anxiety disorder 05/10/2021   Moderate episode of recurrent major depressive disorder (HCC) 05/06/2021   Medication management 05/06/2021   SVD (spontaneous vaginal delivery) 01/31/2020   Obstetrical laceration 01/31/2020   Labor and delivery indication for care or intervention 01/29/2020   History of herpes genitalis 01/29/2020   Depression 01/29/2020   Premature rupture of membranes 01/28/2020    Past Surgical History:  Procedure Laterality Date   TONSILLECTOMY     WISDOM TOOTH EXTRACTION      OB History     Gravida  6   Para  2   Term  2   Preterm      AB  4   Living  2      SAB  4   IAB      Ectopic      Multiple  0   Live Births  2            Home Medications    Prior to Admission medications   Medication Sig Start Date End Date Taking? Authorizing Provider  sertraline (ZOLOFT) 50 MG tablet Take 1 tablet (50 mg total) by mouth daily. 06/23/22  Yes Carsynn Bethune, Noberto Retort, PA-C  acetaminophen (TYLENOL) 500 MG tablet Take 2 tablets (1,000 mg total) by mouth every 8 (eight) hours as needed (pain). 04/08/22   Worthy Rancher, MD  hydrOXYzine (ATARAX/VISTARIL) 10 MG tablet Take 1 tablet (10 mg total) by mouth  3 (three) times daily as needed. 05/10/21   Nwoko, Tommas Olp, PA  ibuprofen (ADVIL) 600 MG tablet Take 1 tablet (600 mg total) by mouth every 6 (six) hours as needed (pain). 04/08/22   Worthy Rancher, MD  Prenatal Vit-Fe Fumarate-FA (PRENATAL MULTIVITAMIN) TABS tablet Take 1 tablet by mouth at bedtime.    [provider]  norethindrone (ORTHO MICRONOR) 0.35 MG tablet Take 1 tablet (0.35 mg total) by mouth daily. Patient not taking: Reported on 03/01/2020 01/31/20 04/27/20  Gerrit Heck, CNM    Family History Family History  Problem Relation Age of Onset   Healthy Mother    Healthy Father     Social History Social History   Tobacco Use   Smoking status: Former    Packs/day: 0.50    Types: Cigarettes    Smokeless tobacco: Never   Tobacco comments:    not while pregnant  Vaping Use   Vaping Use: Former  Substance Use Topics   Alcohol use: Not Currently   Drug use: Not Currently    Types: Marijuana    Comment: March 2020     Allergies   Penicillins and Latex   Review of Systems Review of Systems  Constitutional:  Positive for activity change. Negative for appetite change, fatigue and fever.  Gastrointestinal:  Negative for abdominal pain, diarrhea, nausea and vomiting.  Genitourinary:  Positive for frequency, urgency and vaginal discharge. Negative for dysuria, genital sores, pelvic pain, vaginal bleeding and vaginal pain.  Psychiatric/Behavioral:  Negative for self-injury, sleep disturbance and suicidal ideas. The patient is not nervous/anxious.      Physical Exam Triage Vital Signs ED Triage Vitals  Enc Vitals Group     BP 06/23/22 1935 127/70     Pulse Rate 06/23/22 1935 76     Resp 06/23/22 1935 18     Temp 06/23/22 1935 99.2 F (37.3 C)     Temp src --      SpO2 06/23/22 1935 99 %     Weight --      Height --      Head Circumference --      Peak Flow --      Pain Score 06/23/22 1934 0     Pain Loc --      Pain Edu? --      Excl. in GC? --    No data found.  Updated Vital Signs BP 127/70   Pulse 76   Temp 99.2 F (37.3 C)   Resp 18   LMP 07/14/2021   SpO2 99%   Visual Acuity Right Eye Distance:   Left Eye Distance:   Bilateral Distance:    Right Eye Near:   Left Eye Near:    Bilateral Near:     Physical Exam Vitals reviewed.  Constitutional:      General: She is awake. She is not in acute distress.    Appearance: Normal appearance. She is well-developed. She is not ill-appearing.     Comments: Very pleasant female appears stated age in no acute distress sitting comfortably in exam room  HENT:     Head: Normocephalic and atraumatic.  Cardiovascular:     Rate and Rhythm: Normal rate and regular rhythm.     Heart sounds: Normal heart sounds,  S1 normal and S2 normal. No murmur heard. Pulmonary:     Effort: Pulmonary effort is normal.     Breath sounds: Normal breath sounds. No wheezing, rhonchi or rales.     Comments:  Clear to auscultation bilaterally Abdominal:     General: Bowel sounds are normal.     Palpations: Abdomen is soft.     Tenderness: There is no abdominal tenderness. There is no right CVA tenderness, left CVA tenderness, guarding or rebound.     Comments: Benign abdominal exam  Genitourinary:    Comments: Exam deferred Psychiatric:        Attention and Perception: Attention normal.        Mood and Affect: Mood normal. Mood is not anxious or depressed.        Speech: Speech normal.        Behavior: Behavior is cooperative.        Thought Content: Thought content does not include homicidal or suicidal ideation.     Comments: Normal appearance/hygiene.  Appropriate speech.      UC Treatments / Results  Labs (all labs ordered are listed, but only abnormal results are displayed) Labs Reviewed  POCT URINALYSIS DIPSTICK, ED / UC - Abnormal; Notable for the following components:      Result Value   Hgb urine dipstick TRACE (*)    Leukocytes,Ua TRACE (*)    All other components within normal limits  URINE CULTURE  POC URINE PREG, ED  CERVICOVAGINAL ANCILLARY ONLY    EKG   Radiology No results found.  Procedures Procedures (including critical care time)  Medications Ordered in UC Medications - No data to display  Initial Impression / Assessment and Plan / UC Course  I have reviewed the triage vital signs and the nursing notes.  Pertinent labs & imaging results that were available during my care of the patient were reviewed by me and considered in my medical decision making (see chart for details).     Patient is well-appearing, afebrile, nontoxic, nontachycardic.  Vital signs and physical exam reassuring today.  Urine pregnancy is negative in clinic today.  UA shows hemoglobin and trace  leukocyte esterase.  We will send this for culture but defer antibiotics.  STI swab collected today-results pending.  Will defer treatment until results are available.  Discussed that if this continues to be negative it is possible that her discharge is physiologic.  If it is bothersome she should follow-up with her OB/GYN for additional testing.  Discussed that if she develops any additional symptoms including abdominal pain, pelvic pain, fever, nausea, vomiting she needs to be seen immediately.  Strict return precautions given.  Patient denies any thoughts of suicide/self-harm and contracts for safety today.  Refill of Zoloft at slightly higher dose provided.  Discussed that she should limit NSAID use with this medication due to increased risk of bleeding.  Discussed that this is not something we can provide ongoing refills for.  We will try to establish her with a new primary care via PCP assistance.  Discussed that if she has any worsening symptoms including increased depression, anxiety, panic attacks, thoughts of suicide/self-harm or wanting to harm someone else she needs to be seen immediately to which she expressed understanding.  Strict return precautions given.  Final Clinical Impressions(s) / UC Diagnoses   Final diagnoses:  Vaginal discharge  Urinary frequency  History of depression     Discharge Instructions      Your urine did not show significant evidence of urinary tract infection.  There were some white blood cells but I assume this is more related to vaginal discharge.  We will send this off for culture but defer antibiotics for the time being.  We  will contact you if your swab is abnormal.  Wear loosefitting cotton underwear and use several drinks soaps and detergents.  If you develop any additional symptoms please return for reevaluation.  I have called in a refill of your Zoloft 50 mg.  Please establish with primary care provider for further evaluation and management.  If you  develop any thoughts of suicide/self-harm or wanting to harm someone else you need to go to the emergency room immediately as we discussed.  Do not take NSAIDs including aspirin, ibuprofen/Advil, naproxen/Aleve regularly with this medication as it can cause bleeding.    ED Prescriptions     Medication Sig Dispense Auth. Provider   sertraline (ZOLOFT) 50 MG tablet Take 1 tablet (50 mg total) by mouth daily. 30 tablet Zidan Helget, Noberto Retort, PA-C      PDMP not reviewed this encounter.   Jeani Hawking, PA-C 06/23/22 2024

## 2022-06-24 ENCOUNTER — Telehealth (HOSPITAL_COMMUNITY): Payer: Self-pay | Admitting: Emergency Medicine

## 2022-06-24 LAB — CERVICOVAGINAL ANCILLARY ONLY
Bacterial Vaginitis (gardnerella): NEGATIVE
Candida Glabrata: NEGATIVE
Candida Vaginitis: POSITIVE — AB
Chlamydia: NEGATIVE
Comment: NEGATIVE
Comment: NEGATIVE
Comment: NEGATIVE
Comment: NEGATIVE
Comment: NEGATIVE
Comment: NORMAL
Neisseria Gonorrhea: NEGATIVE
Trichomonas: NEGATIVE

## 2022-06-24 LAB — URINE CULTURE: Culture: NO GROWTH

## 2022-06-24 MED ORDER — FLUCONAZOLE 150 MG PO TABS: 150.0000 mg | ORAL_TABLET | Freq: Once | ORAL | 0 refills | Status: AC

## 2022-06-26 ENCOUNTER — Encounter (HOSPITAL_COMMUNITY): Payer: Self-pay

## 2022-08-12 ENCOUNTER — Encounter (HOSPITAL_BASED_OUTPATIENT_CLINIC_OR_DEPARTMENT_OTHER): Payer: Self-pay

## 2022-08-12 ENCOUNTER — Ambulatory Visit (HOSPITAL_BASED_OUTPATIENT_CLINIC_OR_DEPARTMENT_OTHER): Payer: Medicaid Other | Admitting: Nurse Practitioner

## 2022-10-01 ENCOUNTER — Ambulatory Visit (INDEPENDENT_AMBULATORY_CARE_PROVIDER_SITE_OTHER): Payer: Medicaid Other | Admitting: Nurse Practitioner

## 2022-10-14 NOTE — Progress Notes (Signed)
Patient not seen.

## 2022-12-21 ENCOUNTER — Ambulatory Visit (HOSPITAL_COMMUNITY)
Admission: EM | Admit: 2022-12-21 | Discharge: 2022-12-21 | Disposition: A | Discharge status: Home / Self Care | Payer: Medicaid Other | Attending: Physician Assistant | Admitting: Physician Assistant

## 2022-12-21 ENCOUNTER — Encounter (HOSPITAL_COMMUNITY): Payer: Self-pay

## 2022-12-21 DIAGNOSIS — H60312 Diffuse otitis externa, left ear: Secondary | ICD-10-CM | POA: Insufficient documentation

## 2022-12-21 DIAGNOSIS — N760 Acute vaginitis: Secondary | ICD-10-CM | POA: Diagnosis not present

## 2022-12-21 DIAGNOSIS — H9202 Otalgia, left ear: Secondary | ICD-10-CM

## 2022-12-21 DIAGNOSIS — Z113 Encounter for screening for infections with a predominantly sexual mode of transmission: Secondary | ICD-10-CM | POA: Diagnosis not present

## 2022-12-21 MED ORDER — NEOMYCIN-POLYMYXIN-HC 3.5-10000-1 OT SUSP: 4.0000 [drp] | Freq: Four times a day (QID) | OTIC | 0 refills | Status: DC

## 2022-12-21 NOTE — Discharge Instructions (Signed)
Internal referral to women's health has been made for evaluation of the review problem.  The [facility] will call in the next 48 to 72 hours to arrange an appointment for evaluation.  Lab will be completed in 48 hours.  If you do not get a call from this office that indicates lab is negative.  Log onto MyChart to view the test results with the post in 48 hours.  Advised to use the Cortisporin otic solution, 4 drops in the left ear 4 times a day for the next several days until the left ear infection clears.  Advised follow-up PCP or return to urgent care if symptoms fail to improve.

## 2022-12-21 NOTE — ED Triage Notes (Signed)
Pt c/o crusting drainage and pruritus in right ear.

## 2022-12-21 NOTE — ED Triage Notes (Signed)
Pt is here for right ear pain for several days.  Pt reports vaginal discharge-would like to be check.

## 2022-12-21 NOTE — ED Provider Notes (Signed)
[facility]    CSN: [id number] Arrival date & time: 12/21/22  1121      History   Chief Complaint Chief Complaint  Patient presents with   Vaginal Discharge   Ear Pain    HPI Caitlin Grant is a 27 y.o. female.   27 year old female presents with left ear pain and vaginal discharge.  Patient indicates for the past several days she has been having progressive left ear discomfort, pain which is intermittent, and drainage from the left ear which she indicates is purulent.  She relates she has been trying to clean it with a Q-tip but this has not been given her relief.  Patient denies any fever, and she has not had upper respiratory symptoms over the past couple weeks. Patient indicates that she has been having vaginal discharge which has been thick, white or cream-colored, without any external or internal itching, and no odor.  Patient relates that her last period was 12/06/2022 and this was normal for her.  Patient indicates that she is concerned because she has had intermittent heavy vaginal discharge on a regular basis for many years.  Patient indicates that it just seems to be persistent and progressive since she had her child 8 months ago.  Patient indicates she is not having any urinary symptoms.  No back pain fever or chills.  Patient desires to be evaluated and possibly referred to a specialist for the chronic vaginal discharge.   Vaginal Discharge   Past Medical History:  Diagnosis Date   Depression    Ectopic pregnancy     Patient Active Problem List   Diagnosis Date Noted   Indication for care in labor and delivery, antepartum 04/07/2022   Generalized anxiety disorder 05/10/2021   Moderate episode of recurrent major depressive disorder (HCC) 05/06/2021   Medication management 05/06/2021   SVD (spontaneous vaginal delivery) 01/31/2020   Obstetrical laceration 01/31/2020   Labor and delivery indication for care or intervention 01/29/2020   History of herpes  genitalis 01/29/2020   Depression 01/29/2020   Premature rupture of membranes 01/28/2020    Past Surgical History:  Procedure Laterality Date   TONSILLECTOMY     WISDOM TOOTH EXTRACTION      OB History     Gravida  6   Para  2   Term  2   Preterm      AB  4   Living  2      SAB  4   IAB      Ectopic      Multiple  0   Live Births  2            Home Medications    Prior to Admission medications   Medication Sig Start Date End Date Taking? Authorizing Provider  neomycin-polymyxin-hydrocortisone (CORTISPORIN) 3.5-10000-1 OTIC suspension Place 4 drops into the left ear 4 (four) times daily. 12/21/22  Yes Ellsworth Lennox, PA-C  acetaminophen (TYLENOL) 500 MG tablet Take 2 tablets (1,000 mg total) by mouth every 8 (eight) hours as needed (pain). 04/08/22   Worthy Rancher, MD  hydrOXYzine (ATARAX/VISTARIL) 10 MG tablet Take 1 tablet (10 mg total) by mouth 3 (three) times daily as needed. 05/10/21   Nwoko, Tommas Olp, PA  ibuprofen (ADVIL) 600 MG tablet Take 1 tablet (600 mg total) by mouth every 6 (six) hours as needed (pain). 04/08/22   Worthy Rancher, MD  Prenatal Vit-Fe Fumarate-FA (PRENATAL MULTIVITAMIN) TABS tablet Take 1 tablet by mouth at bedtime.  [provider]  sertraline (ZOLOFT) 50 MG tablet Take 1 tablet (50 mg total) by mouth daily. 06/23/22   Raspet, Derry Skill, PA-C  norethindrone (ORTHO MICRONOR) 0.35 MG tablet Take 1 tablet (0.35 mg total) by mouth daily. Patient not taking: Reported on 03/01/2020 01/31/20 04/27/20  Gavin Pound, CNM    Family History Family History  Problem Relation Age of Onset   Healthy Mother    Healthy Father     Social History Social History   Tobacco Use   Smoking status: Former    Packs/day: 0.50    Types: Cigarettes   Smokeless tobacco: Never   Tobacco comments:    not while pregnant  Vaping Use   Vaping Use: Former  Substance Use Topics   Alcohol use: Not Currently   Drug use: Not Currently     Types: Marijuana    Comment: March 2020     Allergies   Penicillins and Latex   Review of Systems Review of Systems  Genitourinary:  Positive for vaginal discharge (thick and white).     Physical Exam Triage Vital Signs ED Triage Vitals [12/21/22 1229]  Enc Vitals Group     BP 130/87     Pulse Rate 82     Resp 18     Temp 98.7 F (37.1 C)     Temp Source Oral     SpO2 98 %     Weight      Height      Head Circumference      Peak Flow      Pain Score      Pain Loc      Pain Edu?      Excl. in [place]?    No data found.  Updated Vital Signs BP 130/87 (BP Location: Left Arm)   Pulse 82   Temp 98.7 F (37.1 C) (Oral)   Resp 18   LMP 12/06/2022 (Exact Date)   SpO2 98%   Breastfeeding No   Visual Acuity Right Eye Distance:   Left Eye Distance:   Bilateral Distance:    Right Eye Near:   Left Eye Near:    Bilateral Near:     Physical Exam Constitutional:      Appearance: Normal appearance.  HENT:     Right Ear: Tympanic membrane and ear canal normal.     Left Ear: Tympanic membrane normal. Drainage (canal is edematous) present.     Mouth/Throat:     Mouth: Mucous membranes are moist.     Pharynx: Oropharynx is clear.  Cardiovascular:     Rate and Rhythm: Normal rate and regular rhythm.     Heart sounds: Normal heart sounds.  Pulmonary:     Effort: Pulmonary effort is normal.     Breath sounds: Normal breath sounds and air entry. No wheezing, rhonchi or rales.  Abdominal:     General: Abdomen is flat. Bowel sounds are normal.     Palpations: Abdomen is soft.     Tenderness: There is no abdominal tenderness.  Genitourinary:    Comments: Pelvic exam: The external introital area labium majora and labia minora are normal without any unusual swelling or redness. The vaginal canal has mild amount of creamy colored discharge present.  The cervical os is normal with a creamy whitish-yellow discharge present from the os.  There are no unusual lesions present of  the cervical os.  There is no pain on palpation of the ovaries or uterus. Lymphadenopathy:  Cervical: No cervical adenopathy.  Neurological:     Mental Status: She is alert.      UC Treatments / Results  Labs (all labs ordered are listed, but only abnormal results are displayed) Labs Reviewed  CERVICOVAGINAL ANCILLARY ONLY    EKG   Radiology No results found.  Procedures Procedures (including critical care time)  Medications Ordered in UC Medications - No data to display  Initial Impression / Assessment and Plan / UC Course  I have reviewed the triage vital signs and the nursing notes.  Pertinent labs & imaging results that were available during my care of the patient were reviewed by me and considered in my medical decision making (see chart for details).    Plan: 1.  Left ear pain will be treated with the following: A.  Advised ibuprofen or Tylenol OTC to help relieve ear pain. 2.  The acute otitis externa of the left ear will be treated with the following: A.  Cortisporin otic solution, 4 drops in the ear 4 times a day for the next several days to treat the infection. 3.  The acute vaginitis will be treated with the following: A.  Treatment may be considered depending on the results of the STI testing. 4.  Screening for STI will be treated the following: A.  Treatment may be considered depending on the results of the STI testing. B.  Internal referral to women's health center for evaluation of the vaginal discharge has been initiated. 5.  The patient advised follow-up PCP or return to urgent care if symptoms fail to improve. Final Clinical Impressions(s) / UC Diagnoses   Final diagnoses:  Left ear pain  Acute diffuse otitis externa of left ear  Acute vaginitis  Routine screening for STI (sexually transmitted infection)     Discharge Instructions      Internal referral to women's health has been made for evaluation of the review problem.  [facility] will call in the next 48 to 72 hours to arrange an appointment for evaluation.  Lab will be completed in 48 hours.  If you do not get a call from this office that indicates lab is negative.  Log onto MyChart to view the test results with the post in 48 hours.  Advised to use the Cortisporin otic solution, 4 drops in the left ear 4 times a day for the next several days until the left ear infection clears.  Advised follow-up PCP or return to urgent care if symptoms fail to improve.    ED Prescriptions     Medication Sig Dispense Auth. Provider   neomycin-polymyxin-hydrocortisone (CORTISPORIN) 3.5-10000-1 OTIC suspension Place 4 drops into the left ear 4 (four) times daily. 10 mL Nyoka Lint, PA-C      PDMP not reviewed this encounter.   Nyoka Lint, PA-C 12/21/22 1342

## 2022-12-22 LAB — CERVICOVAGINAL ANCILLARY ONLY
Bacterial Vaginitis (gardnerella): NEGATIVE
Candida Glabrata: NEGATIVE
Candida Vaginitis: NEGATIVE
Chlamydia: NEGATIVE
Comment: NEGATIVE
Comment: NEGATIVE
Comment: NEGATIVE
Comment: NEGATIVE
Comment: NEGATIVE
Comment: NORMAL
Neisseria Gonorrhea: NEGATIVE
Trichomonas: NEGATIVE

## 2023-01-30 ENCOUNTER — Encounter (HOSPITAL_COMMUNITY): Payer: Self-pay

## 2023-01-30 ENCOUNTER — Ambulatory Visit (HOSPITAL_COMMUNITY)
Admission: EM | Admit: 2023-01-30 | Discharge: 2023-01-30 | Disposition: A | Discharge status: Home / Self Care | Payer: Medicaid Other | Attending: Internal Medicine | Admitting: Internal Medicine

## 2023-01-30 DIAGNOSIS — F3289 Other specified depressive episodes: Secondary | ICD-10-CM | POA: Diagnosis not present

## 2023-01-30 DIAGNOSIS — Z760 Encounter for issue of repeat prescription: Secondary | ICD-10-CM | POA: Diagnosis not present

## 2023-01-30 MED ORDER — SERTRALINE HCL 50 MG PO TABS: 50.0000 mg | ORAL_TABLET | Freq: Every day | ORAL | 0 refills | Status: DC

## 2023-01-30 NOTE — ED Triage Notes (Signed)
Patient states she missed her psychiatric appointment and needs to have Zoloft refilled. Patient states she took her last pill today.

## 2023-01-30 NOTE — Discharge Instructions (Signed)
Please keep your appointment with your psychiatrist

## 2023-01-30 NOTE — ED Provider Notes (Signed)
[place]    CSN: [id number] Arrival date & time: 01/30/23  1230      History   Chief Complaint Chief Complaint  Patient presents with   Medication Refill    HPI Caitlin Grant is a 27 y.o. female who is here requesting refill of her Zoloft 50 mg for the past 3 years, for depression and anxiety since middle school. She goes to therapy and she missed 2 of her psych appointments  due to not having child care for her kids, and they wont refill it if she missed her appointments. Pt works from home, and only has her kids father care for them when he can help her. Her next appointment is in 5 days. She took her last dose today. She has been stable on this dose and medication.     Past Medical History:  Diagnosis Date   Depression    Ectopic pregnancy     Patient Active Problem List   Diagnosis Date Noted   Indication for care in labor and delivery, antepartum 04/07/2022   Generalized anxiety disorder 05/10/2021   Moderate episode of recurrent major depressive disorder ([place]) 05/06/2021   Medication management 05/06/2021   SVD (spontaneous vaginal delivery) 01/31/2020   Obstetrical laceration 01/31/2020   Labor and delivery indication for care or intervention 01/29/2020   History of herpes genitalis 01/29/2020   Depression 01/29/2020   Premature rupture of membranes 01/28/2020    Past Surgical History:  Procedure Laterality Date   TONSILLECTOMY     WISDOM TOOTH EXTRACTION      OB History     Gravida  6   Para  2   Term  2   Preterm      AB  4   Living  2      SAB  4   IAB      Ectopic      Multiple  0   Live Births  2            Home Medications    Prior to Admission medications   Medication Sig Start Date End Date Taking? Authorizing Provider  acetaminophen (TYLENOL) 500 MG tablet Take 2 tablets (1,000 mg total) by mouth every 8 (eight) hours as needed (pain). 04/08/22   Genia Del, MD  ibuprofen (ADVIL) 600 MG tablet  Take 1 tablet (600 mg total) by mouth every 6 (six) hours as needed (pain). 04/08/22   Genia Del, MD  Prenatal Vit-Fe Fumarate-FA (PRENATAL MULTIVITAMIN) TABS tablet Take 1 tablet by mouth at bedtime.    [provider]  sertraline (ZOLOFT) 50 MG tablet Take 1 tablet (50 mg total) by mouth daily. 01/30/23   Rodriguez-Southworth, Sunday Spillers, PA-C  norethindrone (ORTHO MICRONOR) 0.35 MG tablet Take 1 tablet (0.35 mg total) by mouth daily. Patient not taking: Reported on 03/01/2020 01/31/20 04/27/20  Gavin Pound, CNM    Family History Family History  Problem Relation Age of Onset   Healthy Mother    Healthy Father     Social History Social History   Tobacco Use   Smoking status: Former    Packs/day: 0.50    Types: Cigarettes   Smokeless tobacco: Never   Tobacco comments:    not while pregnant  Vaping Use   Vaping Use: Former  Substance Use Topics   Alcohol use: Not Currently   Drug use: Not Currently    Types: Marijuana    Comment: March 2020     Allergies  Penicillins and Latex   Review of Systems Review of Systems  Psychiatric/Behavioral:  Negative for agitation, behavioral problems, decreased concentration and sleep disturbance. The patient is nervous/anxious.        Has more issues with anxiety,  but is stable on current med     Physical Exam Triage Vital Signs ED Triage Vitals  Enc Vitals Group     BP 01/30/23 1310 122/79     Pulse Rate 01/30/23 1310 78     Resp 01/30/23 1310 16     Temp 01/30/23 1310 98.8 F (37.1 C)     Temp Source 01/30/23 1310 Oral     SpO2 01/30/23 1310 98 %     Weight --      Height --      Head Circumference --      Peak Flow --      Pain Score 01/30/23 1313 0     Pain Loc --      Pain Edu? --      Excl. in [place]? --    No data found.  Updated Vital Signs BP 122/79 (BP Location: Left Arm)   Pulse 78   Temp 98.8 F (37.1 C) (Oral)   Resp 16   LMP 01/27/2023   SpO2 98%   Visual Acuity Right Eye Distance:    Left Eye Distance:   Bilateral Distance:    Right Eye Near:   Left Eye Near:    Bilateral Near:     Physical Exam Vitals and nursing note reviewed.  Constitutional:      General: She is not in acute distress.    Appearance: Normal appearance. She is normal weight. She is not toxic-appearing.     Comments: Well kept, smiling and is very talkative  HENT:     Right Ear: External ear normal.     Left Ear: External ear normal.  Eyes:     General: No scleral icterus.    Conjunctiva/sclera: Conjunctivae normal.  Neck:     Comments: No thyroidmegally Cardiovascular:     Rate and Rhythm: Normal rate and regular rhythm.     Heart sounds: No murmur heard. Pulmonary:     Effort: Pulmonary effort is normal.     Breath sounds: Normal breath sounds.  Musculoskeletal:        General: Normal range of motion.     Cervical back: Neck supple.  Lymphadenopathy:     Cervical: No cervical adenopathy.  Skin:    General: Skin is warm and dry.  Neurological:     Mental Status: She is alert and oriented to person, place, and time.     Gait: Gait normal.  Psychiatric:        Mood and Affect: Mood normal.        Behavior: Behavior normal.        Thought Content: Thought content normal.        Judgment: Judgment normal.      UC Treatments / Results  Labs (all labs ordered are listed, but only abnormal results are displayed) Labs Reviewed - No data to display  EKG   Radiology No results found.  Procedures Procedures (including critical care time)  Medications Ordered in UC Medications - No data to display  Initial Impression / Assessment and Plan / UC Course  I have reviewed the triage vital signs and the nursing notes.  Depression and anxiety stable  I refilled her Zoloft until her visit next week.   Final Clinical  Impressions(s) / UC Diagnoses   Final diagnoses:  Medication refill     Discharge Instructions      Please keep your appointment with your psychiatrist       ED Prescriptions     Medication Sig Dispense Auth. Provider   sertraline (ZOLOFT) 50 MG tablet Take 1 tablet (50 mg total) by mouth daily. 6 tablet Rodriguez-Southworth, Sunday Spillers, PA-C      PDMP not reviewed this encounter.   Shelby Mattocks, PA-C 01/30/23 1354

## 2023-02-18 DIAGNOSIS — Z79899 Other long term (current) drug therapy: Secondary | ICD-10-CM | POA: Diagnosis not present

## 2023-03-11 DIAGNOSIS — Z79899 Other long term (current) drug therapy: Secondary | ICD-10-CM | POA: Diagnosis not present

## 2023-05-13 ENCOUNTER — Encounter (HOSPITAL_COMMUNITY): Payer: Self-pay | Admitting: Emergency Medicine

## 2023-05-13 ENCOUNTER — Ambulatory Visit (HOSPITAL_COMMUNITY)
Admission: EM | Admit: 2023-05-13 | Discharge: 2023-05-13 | Disposition: A | Discharge status: Home / Self Care | Payer: Medicaid Other | Attending: Internal Medicine | Admitting: Internal Medicine

## 2023-05-13 DIAGNOSIS — F411 Generalized anxiety disorder: Secondary | ICD-10-CM | POA: Diagnosis not present

## 2023-05-13 DIAGNOSIS — N760 Acute vaginitis: Secondary | ICD-10-CM | POA: Diagnosis not present

## 2023-05-13 DIAGNOSIS — Z711 Person with feared health complaint in whom no diagnosis is made: Secondary | ICD-10-CM | POA: Insufficient documentation

## 2023-05-13 DIAGNOSIS — B9689 Other specified bacterial agents as the cause of diseases classified elsewhere: Secondary | ICD-10-CM | POA: Diagnosis not present

## 2023-05-13 LAB — POCT URINE PREGNANCY: Preg Test, Ur: NEGATIVE

## 2023-05-13 LAB — HIV ANTIBODY (ROUTINE TESTING W REFLEX): HIV Screen 4th Generation wRfx: NONREACTIVE

## 2023-05-13 MED ORDER — SERTRALINE HCL 50 MG PO TABS: 50.0000 mg | ORAL_TABLET | Freq: Every day | ORAL | 0 refills | Status: DC

## 2023-05-13 MED ORDER — METRONIDAZOLE 500 MG PO TABS: 500.0000 mg | ORAL_TABLET | Freq: Two times a day (BID) | ORAL | 0 refills | Status: DC

## 2023-05-13 NOTE — ED Triage Notes (Signed)
Pt reports having vaginal odor for 3-4 weeks and has been tested several times and told nothing wrong. Wants to know if has a Ph issues. Doesn't have a GYN. Does have little discharge.   Reports had new sexual partner instead of baby's father and odor started after then.

## 2023-05-13 NOTE — Discharge Instructions (Signed)
Take Flagyl 500mg  2x per day for 7 days Use unscented soaps Will call you with results See Dr. Melissa Noon as your new doctor!  It was nice to meet you today!

## 2023-05-13 NOTE — ED Provider Notes (Signed)
[facility]   [id number] 05/13/23 Arrival Time: 0907  ASSESSMENT & PLAN:  1. BV (bacterial vaginosis)   2. Concern about STD in female without diagnosis   3. Generalized anxiety disorder    - Clinical presentation is consistent with BV.  Will treat empirically with Flagyl 500 mg twice daily x 7 days.  Will send self swab to the lab for confirmation.   -Patient is sexually active with 1 new female partner.  Does not use anything to protect against STDs.  Would like STD testing today.  Blood sample collected for HIV/RPR  Will test for gonorrhea/chlamydia/trichomonas on the swab.  Will call if positive.  -History of generalized anxiety disorder.  Needs refill of Zoloft.  30-day supply given.  Encouraged and establish care at [facility] family medicine for longitudinal management.  Meds ordered this encounter  Medications   metroNIDAZOLE (FLAGYL) 500 MG tablet    Sig: Take 1 tablet (500 mg total) by mouth 2 (two) times daily.    Dispense:  14 tablet    Refill:  0   sertraline (ZOLOFT) 50 MG tablet    Sig: Take 1 tablet (50 mg total) by mouth daily.    Dispense:  30 tablet    Refill:  0   Discharge Instructions   None     Follow-up Information     Dameron, Nolberto Hanlon, DO.   Specialty: Family Medicine Contact information: [address] [place] [address] [phone]                  Reviewed expectations re: course of current medical issues. Questions answered. Outlined signs and symptoms indicating need for more acute intervention. Patient verbalized understanding. After Visit Summary given.   SUBJECTIVE: Pleasant 27 year old female comes to urgent care to be evaluated for vaginal odor.  She has has had a vaginal odor for a few weeks.  It started when she had a new sexual partner.  She has tried using different soaps and has actually tried putting soap inside the vagina but the odor persists.  She thinks she has had a little bit of discharge that is thin.  No  white clumpy discharge or vaginal itching.  She is sexually active with 1 new female partner.  He has a negative STD testing recently.  She would like some STD testing today.  She finished her period a couple weeks ago.  She has not used anything for birth control.  She also pregnancy test today.  Also has a history of generalized anxiety, is on Zoloft.  She was fired from her psych office for missed appointments.  She needs refill on Zoloft.  She also needs a new primary care doctor to help her refill this.  No LMP recorded. Past Surgical History:  Procedure Laterality Date   TONSILLECTOMY     WISDOM TOOTH EXTRACTION       OBJECTIVE:  Vitals:   05/13/23 0931  BP: 137/66  Pulse: 74  Resp: 16  Temp: 98.3 F (36.8 C)  TempSrc: Oral  SpO2: 99%     Physical Exam Vitals and nursing note reviewed.  Constitutional:      General: She is not in acute distress.    Appearance: Normal appearance.  Cardiovascular:     Rate and Rhythm: Normal rate.  Pulmonary:     Effort: Pulmonary effort is normal.  Abdominal:     General: Abdomen is flat.     Palpations: Abdomen is soft.     Tenderness: There  is no abdominal tenderness. There is no guarding.  Musculoskeletal:        General: Normal range of motion.  Neurological:     General: No focal deficit present.  Psychiatric:        Mood and Affect: Mood normal.      Labs: Results for orders placed or performed during the hospital encounter of 05/13/23  POCT urine pregnancy  Result Value Ref Range   Preg Test, Ur Negative Negative   Labs Reviewed  RPR  HIV ANTIBODY (ROUTINE TESTING W REFLEX)  POCT URINE PREGNANCY  CERVICOVAGINAL ANCILLARY ONLY    Imaging: No results found.   Allergies  Allergen Reactions   Penicillins Shortness Of Breath and Rash    Did it involve swelling of the face/tongue/throat, SOB, or low BP? Yes Did it involve sudden or severe rash/hives, skin peeling, or any reaction on the inside of your mouth or  nose? Unk Did you need to seek medical attention at a hospital or doctor's office? Yes When did it last happen? "I was a baby" If all above answers are "NO", may proceed with cephalosporin use.    Latex Rash    Rash where latex makes contact                                               Past Medical History:  Diagnosis Date   Depression    Ectopic pregnancy     Social History   Socioeconomic History   Marital status: Single    Spouse name: Not on file   Number of children: Not on file   Years of education: Not on file   Highest education level: Not on file  Occupational History   Not on file  Tobacco Use   Smoking status: Former    Packs/day: .5    Types: Cigarettes   Smokeless tobacco: Never   Tobacco comments:    not while pregnant  Vaping Use   Vaping Use: Former  Substance and Sexual Activity   Alcohol use: Not Currently   Drug use: Not Currently    Types: Marijuana    Comment: March 2020   Sexual activity: Not Currently    Birth control/protection: None  Other Topics Concern   Not on file  Social History Narrative   Not on file   Social Determinants of Health   Financial Resource Strain: Not on file  Food Insecurity: Not on file  Transportation Needs: Not on file  Physical Activity: Not on file  Stress: Not on file  Social Connections: Not on file  Intimate Partner Violence: Not on file    Family History  Problem Relation Age of Onset   Healthy Mother    Healthy Father       Stanislaus Kaltenbach, Baldemar Friday, MD 05/13/23 1125

## 2023-05-14 ENCOUNTER — Telehealth (HOSPITAL_COMMUNITY): Payer: Self-pay | Admitting: Emergency Medicine

## 2023-05-14 LAB — CERVICOVAGINAL ANCILLARY ONLY
Bacterial Vaginitis (gardnerella): POSITIVE — AB
Candida Glabrata: NEGATIVE
Candida Vaginitis: POSITIVE — AB
Chlamydia: NEGATIVE
Comment: NEGATIVE
Comment: NEGATIVE
Comment: NEGATIVE
Comment: NEGATIVE
Comment: NEGATIVE
Comment: NORMAL
Neisseria Gonorrhea: NEGATIVE
Trichomonas: NEGATIVE

## 2023-05-14 LAB — RPR: RPR Ser Ql: NONREACTIVE

## 2023-05-14 MED ORDER — FLUCONAZOLE 150 MG PO TABS: 150.0000 mg | ORAL_TABLET | Freq: Once | ORAL | 0 refills | Status: AC

## 2023-06-20 ENCOUNTER — Ambulatory Visit (HOSPITAL_COMMUNITY)
Admission: EM | Admit: 2023-06-20 | Discharge: 2023-06-20 | Disposition: A | Discharge status: Home / Self Care | Payer: Medicaid Other | Attending: Emergency Medicine | Admitting: Emergency Medicine

## 2023-06-20 DIAGNOSIS — F411 Generalized anxiety disorder: Secondary | ICD-10-CM | POA: Diagnosis not present

## 2023-06-20 MED ORDER — SERTRALINE HCL 50 MG PO TABS: 50.0000 mg | ORAL_TABLET | Freq: Every day | ORAL | 0 refills | Status: DC

## 2023-06-20 NOTE — Discharge Instructions (Signed)
Your medicine has been refilled for 10 days  During this time It is imperative that you follow-up at the behavioral [facility] to establish care with a doctor so they can manage your medicine as we are unable to continue to refill this as it needs to be monitored

## 2023-06-20 NOTE — ED Provider Notes (Signed)
[facility]    CSN: [id number] Arrival date & time: 06/20/23  1413      History   Chief Complaint No chief complaint on file.   HPI Caitlin Grant is a 27 y.o. female.   Patient presents requesting refill for Zoloft.  Taking for anxiety.  Currently in between care by psychiatrist.  Last dose 1 day ago.   Past Medical History:  Diagnosis Date   Depression    Ectopic pregnancy     Patient Active Problem List   Diagnosis Date Noted   Indication for care in labor and delivery, antepartum 04/07/2022   Generalized anxiety disorder 05/10/2021   Moderate episode of recurrent major depressive disorder (HCC) 05/06/2021   Medication management 05/06/2021   SVD (spontaneous vaginal delivery) 01/31/2020   Obstetrical laceration 01/31/2020   Labor and delivery indication for care or intervention 01/29/2020   History of herpes genitalis 01/29/2020   Depression 01/29/2020   Premature rupture of membranes 01/28/2020    Past Surgical History:  Procedure Laterality Date   TONSILLECTOMY     WISDOM TOOTH EXTRACTION      OB History     Gravida  6   Para  2   Term  2   Preterm      AB  4   Living  2      SAB  4   IAB      Ectopic      Multiple  0   Live Births  2            Home Medications    Prior to Admission medications   Medication Sig Start Date End Date Taking? Authorizing Provider  acetaminophen (TYLENOL) 500 MG tablet Take 2 tablets (1,000 mg total) by mouth every 8 (eight) hours as needed (pain). 04/08/22   Worthy Rancher, MD  ibuprofen (ADVIL) 600 MG tablet Take 1 tablet (600 mg total) by mouth every 6 (six) hours as needed (pain). 04/08/22   Worthy Rancher, MD  metroNIDAZOLE (FLAGYL) 500 MG tablet Take 1 tablet (500 mg total) by mouth 2 (two) times daily. 05/13/23   Rafoth, Baldemar Friday, MD  Prenatal Vit-Fe Fumarate-FA (PRENATAL MULTIVITAMIN) TABS tablet Take 1 tablet by mouth at bedtime.    [provider]  sertraline  (ZOLOFT) 50 MG tablet Take 1 tablet (50 mg total) by mouth daily for 10 days. 06/20/23 06/30/23  Valinda Hoar, NP  norethindrone (ORTHO MICRONOR) 0.35 MG tablet Take 1 tablet (0.35 mg total) by mouth daily. Patient not taking: Reported on 03/01/2020 01/31/20 04/27/20  Gerrit Heck, CNM    Family History Family History  Problem Relation Age of Onset   Healthy Mother    Healthy Father     Social History Social History   Tobacco Use   Smoking status: Former    Packs/day: .5    Types: Cigarettes   Smokeless tobacco: Never   Tobacco comments:    not while pregnant  Vaping Use   Vaping Use: Former  Substance Use Topics   Alcohol use: Not Currently   Drug use: Not Currently    Types: Marijuana    Comment: March 2020     Allergies   Penicillins and Latex   Review of Systems Review of Systems   Physical Exam Triage Vital Signs ED Triage Vitals  Enc Vitals Group     BP      Pulse      Resp      Temp  Temp src      SpO2      Weight      Height      Head Circumference      Peak Flow      Pain Score      Pain Loc      Pain Edu?      Excl. in GC?    No data found.  Updated Vital Signs There were no vitals taken for this visit.  Visual Acuity Right Eye Distance:   Left Eye Distance:   Bilateral Distance:    Right Eye Near:   Left Eye Near:    Bilateral Near:     Physical Exam Constitutional:      Appearance: Normal appearance.  Eyes:     Extraocular Movements: Extraocular movements intact.  Pulmonary:     Effort: Pulmonary effort is normal.  Neurological:     Mental Status: She is alert and oriented to person, place, and time. Mental status is at baseline.  Psychiatric:        Mood and Affect: Mood is anxious. Affect is tearful.      UC Treatments / Results  Labs (all labs ordered are listed, but only abnormal results are displayed) Labs Reviewed - No data to display  EKG   Radiology No results found.  Procedures Procedures  (including critical care time)  Medications Ordered in UC Medications - No data to display  Initial Impression / Assessment and Plan / UC Course  I have reviewed the triage vital signs and the nursing notes.  Pertinent labs & imaging results that were available during my care of the patient were reviewed by me and considered in my medical decision making (see chart for details).  Anxiety state  Patient anxious, discussed that urgent care cannot continue to refill medication as we are not monitoring condition and that she will need to establish care with psychiatrist, patient endorses that she was notified by the behavioral health clinic that they would not refill medication today and she was come to this clinic to do so, medicine has been refilled for 10 days and she has been urged to establish care on Monday with psychiatrist for further management, patient verbalized understanding and plans to follow-up Final Clinical Impressions(s) / UC Diagnoses   Final diagnoses:  Anxiety state     Discharge Instructions      Your medicine has been refilled for 10 days  During this time It is imperative that you follow-up at the behavioral Health Center to establish care with a doctor so they can manage your medicine as we are unable to continue to refill this as it needs to be monitored   ED Prescriptions     Medication Sig Dispense Auth. Provider   sertraline (ZOLOFT) 50 MG tablet Take 1 tablet (50 mg total) by mouth daily for 10 days. 10 tablet Valinda Hoar, NP      PDMP not reviewed this encounter.   Valinda Hoar, NP 06/20/23 1440

## 2023-06-20 NOTE — ED Triage Notes (Signed)
Pt presents to office for med refill.

## 2023-11-24 ENCOUNTER — Inpatient Hospital Stay (HOSPITAL_COMMUNITY)
Admission: AD | Admit: 2023-11-24 | Discharge: 2023-11-24 | Disposition: A | Discharge status: Home / Self Care | Payer: Medicaid Other | Attending: Obstetrics and Gynecology | Admitting: Obstetrics and Gynecology

## 2023-11-24 ENCOUNTER — Other Ambulatory Visit: Payer: Self-pay

## 2023-11-24 DIAGNOSIS — Z3200 Encounter for pregnancy test, result unknown: Secondary | ICD-10-CM | POA: Diagnosis present

## 2023-11-24 NOTE — MAU Note (Signed)
Caitlin Grant is a 27 y.o. at Unknown here in MAU reporting: she needs pregnancy confirmation and dating secondary probable  social issues.  Denies pain or VB.  Endorses fatigue. LMP: 10/24/2023 Onset of complaint: today Pain score: 0 Vitals:   11/24/23 1855  BP: 109/61  Pulse: (!) 108  Resp: 18  Temp: 98.3 F (36.8 C)  SpO2: 99%     FHT:NA Lab orders placed from triage:   None

## 2023-11-24 NOTE — Progress Notes (Signed)
Caitlin Grant CNM discussed positive pregnancy test here and follow up with pt. Pt then d/c home by CNM from Triage

## 2023-11-24 NOTE — MAU Provider Note (Signed)
Event Date/Time   First Provider Initiated Contact with Patient 11/24/23 1936      S Ms. Caitlin Grant is a 27 y.o. [id number] patient who presents to MAU today without complaints. She desires a confirmation of pregnancy.    O BP 109/61 (BP Location: Right Arm)   Pulse (!) 108   Temp 98.3 F (36.8 C) (Oral)   Resp 18   Ht 5\' 8"  (1.727 m)   Wt 80.2 kg   LMP 10/24/2023   SpO2 99%   BMI 26.90 kg/m  Physical Exam Vitals and nursing note reviewed.  Constitutional:      General: She is not in acute distress.    Appearance: Normal appearance.  HENT:     Head: Normocephalic.  Pulmonary:     Effort: Pulmonary effort is normal.  Musculoskeletal:     Cervical back: Normal range of motion.  Skin:    General: Skin is warm and dry.  Neurological:     Mental Status: She is alert and oriented to person, place, and time.  Psychiatric:        Mood and Affect: Mood normal.     A Medical screening exam complete  Directed patient to [facility]  to get a confirmation of pregnancy. Patient verbalized understanding.   P Discharge from MAU in stable condition Patient given the option of transfer to [facility] for further evaluation or seek care in outpatient facility of choice  List of options for follow-up given  Warning signs for worsening condition that would warrant emergency follow-up discussed Patient may return to MAU as needed   Carlynn Herald, [place] 11/24/2023 7:39 PM

## 2023-11-24 NOTE — MAU Note (Signed)
Dorathy Daft CNM in Triage to see pt and discuss plan of care

## 2024-01-18 ENCOUNTER — Ambulatory Visit (HOSPITAL_COMMUNITY)
Admission: EM | Admit: 2024-01-18 | Discharge: 2024-01-18 | Disposition: A | Discharge status: Home / Self Care | Payer: Medicaid Other | Attending: Family Medicine | Admitting: Family Medicine

## 2024-01-18 ENCOUNTER — Encounter (HOSPITAL_COMMUNITY): Payer: Self-pay | Admitting: *Deleted

## 2024-01-18 DIAGNOSIS — N760 Acute vaginitis: Secondary | ICD-10-CM | POA: Diagnosis not present

## 2024-01-18 MED ORDER — METRONIDAZOLE 500 MG PO TABS: 500.0000 mg | ORAL_TABLET | Freq: Two times a day (BID) | ORAL | 0 refills | Status: AC

## 2024-01-18 NOTE — Discharge Instructions (Signed)
Staff will notify you if there is anything positive on the swab  Take metronidazole 500 mg--1 tablet 2 times daily for 7 days.  Avoid drinking alcohol within 72 hours of taking this medication

## 2024-01-18 NOTE — ED Triage Notes (Signed)
Pt states she took an abortion pill in January. Since she has had a foul odor and a vaginal discharge. LMP was 01/16/2024

## 2024-01-19 LAB — CERVICOVAGINAL ANCILLARY ONLY
Bacterial Vaginitis (gardnerella): POSITIVE — AB
Candida Glabrata: NEGATIVE
Candida Vaginitis: NEGATIVE
Chlamydia: NEGATIVE
Comment: NEGATIVE
Comment: NEGATIVE
Comment: NEGATIVE
Comment: NEGATIVE
Comment: NEGATIVE
Comment: NORMAL
Neisseria Gonorrhea: NEGATIVE
Trichomonas: NEGATIVE

## 2024-01-19 NOTE — ED Provider Notes (Signed)
[facility]    CSN: [id number] Arrival date & time: 01/18/24  1905      History   Chief Complaint Chief Complaint  Patient presents with   SEXUALLY TRANSMITTED DISEASE    HPI Caitlin Grant is a 28 y.o. female.   HPI Here for vaginal dc.   Around 1/1 or the very end of December, she took an abortion pill. The has had a menstrual cycle in the last few days.  Before that she had some vag discharge with some odor, but now the odor is worse. No pelvic pain and no abdominal pain. No f/c or dysuria. No n/v.  She has not had sexual relations since October.  She is allergic to PCN Past Medical History:  Diagnosis Date   Depression    Ectopic pregnancy     Patient Active Problem List   Diagnosis Date Noted   Indication for care in labor and delivery, antepartum 04/07/2022   Generalized anxiety disorder 05/10/2021   Moderate episode of recurrent major depressive disorder (HCC) 05/06/2021   Medication management 05/06/2021   SVD (spontaneous vaginal delivery) 01/31/2020   Obstetrical laceration 01/31/2020   Labor and delivery indication for care or intervention 01/29/2020   History of herpes genitalis 01/29/2020   Depression 01/29/2020   Premature rupture of membranes 01/28/2020    Past Surgical History:  Procedure Laterality Date   TONSILLECTOMY     WISDOM TOOTH EXTRACTION      OB History     Gravida  6   Para  2   Term  2   Preterm      AB  4   Living  2      SAB  4   IAB      Ectopic      Multiple  0   Live Births  2            Home Medications    Prior to Admission medications   Medication Sig Start Date End Date Taking? Authorizing Provider  metroNIDAZOLE (FLAGYL) 500 MG tablet Take 1 tablet (500 mg total) by mouth 2 (two) times daily for 7 days. 01/18/24 01/25/24 Yes Zenia Resides, MD  acetaminophen (TYLENOL) 500 MG tablet Take 2 tablets (1,000 mg total) by mouth every 8 (eight) hours as needed (pain). 04/08/22    Worthy Rancher, MD  Prenatal Vit-Fe Fumarate-FA (PRENATAL MULTIVITAMIN) TABS tablet Take 1 tablet by mouth at bedtime.    [provider]  sertraline (ZOLOFT) 50 MG tablet Take 1 tablet (50 mg total) by mouth daily for 10 days. 06/20/23 06/30/23  Valinda Hoar, NP  norethindrone (ORTHO MICRONOR) 0.35 MG tablet Take 1 tablet (0.35 mg total) by mouth daily. Patient not taking: Reported on 03/01/2020 01/31/20 04/27/20  Gerrit Heck, CNM    Family History Family History  Problem Relation Age of Onset   Healthy Mother    Healthy Father     Social History Social History   Tobacco Use   Smoking status: Former    Current packs/day: 0.50    Types: Cigarettes   Smokeless tobacco: Never   Tobacco comments:    not while pregnant  Vaping Use   Vaping status: Former  Substance Use Topics   Alcohol use: Not Currently   Drug use: Not Currently    Types: Marijuana    Comment: March 2020     Allergies   Penicillins and Latex   Review of Systems Review of Systems   Physical  Exam Triage Vital Signs ED Triage Vitals  Encounter Vitals Group     BP 01/18/24 2034 136/88     Systolic BP Percentile --      Diastolic BP Percentile --      Pulse Rate 01/18/24 2034 86     Resp 01/18/24 2034 16     Temp 01/18/24 2034 99.4 F (37.4 C)     Temp Source 01/18/24 2034 Oral     SpO2 01/18/24 2034 98 %     Weight --      Height --      Head Circumference --      Peak Flow --      Pain Score 01/18/24 2033 0     Pain Loc --      Pain Education --      Exclude from Growth Chart --    No data found.  Updated Vital Signs BP 136/88 (BP Location: Right Arm)   Pulse 86   Temp 99.4 F (37.4 C) (Oral)   Resp 16   LMP 01/16/2024 (Exact Date)   SpO2 98%   Visual Acuity Right Eye Distance:   Left Eye Distance:   Bilateral Distance:    Right Eye Near:   Left Eye Near:    Bilateral Near:     Physical Exam Vitals reviewed.  Constitutional:      General: She is not in  acute distress.    Appearance: She is not toxic-appearing.  HENT:     Mouth/Throat:     Mouth: Mucous membranes are moist.  Eyes:     Extraocular Movements: Extraocular movements intact.     Conjunctiva/sclera: Conjunctivae normal.     Pupils: Pupils are equal, round, and reactive to light.  Cardiovascular:     Rate and Rhythm: Normal rate and regular rhythm.     Heart sounds: No murmur heard. Pulmonary:     Effort: Pulmonary effort is normal.     Breath sounds: Normal breath sounds.  Abdominal:     Palpations: Abdomen is soft.     Tenderness: There is no abdominal tenderness.  Musculoskeletal:     Cervical back: Neck supple.  Lymphadenopathy:     Cervical: No cervical adenopathy.  Skin:    Coloration: Skin is not pale.  Neurological:     General: No focal deficit present.     Mental Status: She is alert and oriented to person, place, and time.  Psychiatric:        Behavior: Behavior normal.      UC Treatments / Results  Labs (all labs ordered are listed, but only abnormal results are displayed) Labs Reviewed  CERVICOVAGINAL ANCILLARY ONLY - Abnormal; Notable for the following components:      Result Value   Bacterial Vaginitis (gardnerella) Positive (*)    All other components within normal limits    EKG   Radiology No results found.  Procedures Procedures (including critical care time)  Medications Ordered in UC Medications - No data to display  Initial Impression / Assessment and Plan / UC Course  I have reviewed the triage vital signs and the nursing notes.  Pertinent labs & imaging results that were available during my care of the patient were reviewed by me and considered in my medical decision making (see chart for details).     Vaginal self swab is done, and we will notify of any positives on that and treat per protocol.  With the odor she describes, Flagyl is sent in  to treat empirically for BV. Final Clinical Impressions(s) / UC Diagnoses    Final diagnoses:  Acute vaginitis     Discharge Instructions      Staff will notify you if there is anything positive on the swab.  Take metronidazole 500 mg--1 tablet 2 times daily for 7 days.  Avoid drinking alcohol within 72 hours of taking this medication      ED Prescriptions     Medication Sig Dispense Auth. Provider   metroNIDAZOLE (FLAGYL) 500 MG tablet Take 1 tablet (500 mg total) by mouth 2 (two) times daily for 7 days. 14 tablet Anjelica Gorniak, Janace Aris, MD      PDMP not reviewed this encounter.   Zenia Resides, MD 01/19/24 1320

## 2024-02-15 ENCOUNTER — Ambulatory Visit (HOSPITAL_COMMUNITY)
Admission: EM | Admit: 2024-02-15 | Discharge: 2024-02-15 | Disposition: A | Discharge status: Home / Self Care | Attending: Family Medicine | Admitting: Family Medicine

## 2024-02-15 ENCOUNTER — Encounter (HOSPITAL_COMMUNITY): Payer: Self-pay | Admitting: *Deleted

## 2024-02-15 DIAGNOSIS — R102 Pelvic and perineal pain: Secondary | ICD-10-CM | POA: Diagnosis not present

## 2024-02-15 LAB — HCG, SERUM, QUALITATIVE: Preg, Serum: NEGATIVE

## 2024-02-15 LAB — POCT URINE PREGNANCY: Preg Test, Ur: NEGATIVE

## 2024-02-15 LAB — HIV ANTIBODY (ROUTINE TESTING W REFLEX): HIV Screen 4th Generation wRfx: NONREACTIVE

## 2024-02-15 MED ORDER — ONDANSETRON 4 MG PO TBDP: 4.0000 mg | ORAL_TABLET | Freq: Three times a day (TID) | ORAL | 0 refills | Status: DC | PRN

## 2024-02-15 NOTE — ED Provider Notes (Addendum)
 [facility]    CSN: [id number] Arrival date & time: 02/15/24  1341      History   Chief Complaint Chief Complaint  Patient presents with   Abdominal Pain   Nausea    HPI Caitlin Grant is a 28 y.o. female.    Abdominal Pain Here for pelvic pain it has been bothering her off and on in the last week.  Staff understood her to say it was in her right lower quadrant, but to me she shows me that she is hurting across her suprapubic, right lower quadrant and left lower quadrant areas.  No dysuria.  No fever or vomiting, though she has had some nausea in the last week.  She did have a medical abortion in January and then had a menstrual cycle that started on January 26 and she bled for about 5 to 7 days.  Then last week she had couple of days of spotting.  She did take a UPT at home that was negative.  She is allergic to penicillins and latex  Past Medical History:  Diagnosis Date   Depression    Ectopic pregnancy     Patient Active Problem List   Diagnosis Date Noted   Indication for care in labor and delivery, antepartum 04/07/2022   Generalized anxiety disorder 05/10/2021   Moderate episode of recurrent major depressive disorder (HCC) 05/06/2021   Medication management 05/06/2021   SVD (spontaneous vaginal delivery) 01/31/2020   Obstetrical laceration 01/31/2020   Labor and delivery indication for care or intervention 01/29/2020   History of herpes genitalis 01/29/2020   Depression 01/29/2020   Premature rupture of membranes 01/28/2020    Past Surgical History:  Procedure Laterality Date   TONSILLECTOMY     WISDOM TOOTH EXTRACTION      OB History     Gravida  6   Para  2   Term  2   Preterm      AB  4   Living  2      SAB  4   IAB      Ectopic      Multiple  0   Live Births  2            Home Medications    Prior to Admission medications   Medication Sig Start Date End Date Taking? Authorizing Provider  ondansetron  (ZOFRAN-ODT) 4 MG disintegrating tablet Take 1 tablet (4 mg total) by mouth every 8 (eight) hours as needed for nausea or vomiting. 02/15/24  Yes Zenia Resides, MD  acetaminophen (TYLENOL) 500 MG tablet Take 2 tablets (1,000 mg total) by mouth every 8 (eight) hours as needed (pain). 04/08/22   Worthy Rancher, MD  Prenatal Vit-Fe Fumarate-FA (PRENATAL MULTIVITAMIN) TABS tablet Take 1 tablet by mouth at bedtime.    [provider]  sertraline (ZOLOFT) 50 MG tablet Take 1 tablet (50 mg total) by mouth daily for 10 days. 06/20/23 06/30/23  Valinda Hoar, NP  norethindrone (ORTHO MICRONOR) 0.35 MG tablet Take 1 tablet (0.35 mg total) by mouth daily. Patient not taking: Reported on 03/01/2020 01/31/20 04/27/20  Gerrit Heck, CNM    Family History Family History  Problem Relation Age of Onset   Healthy Mother    Healthy Father     Social History Social History   Tobacco Use   Smoking status: Former    Current packs/day: 0.50    Types: Cigarettes   Smokeless tobacco: Never   Tobacco comments:  not while pregnant  Vaping Use   Vaping status: Former  Substance Use Topics   Alcohol use: Not Currently   Drug use: Not Currently    Types: Marijuana    Comment: March 2020     Allergies   Penicillins and Latex   Review of Systems Review of Systems  Gastrointestinal:  Positive for abdominal pain.     Physical Exam Triage Vital Signs ED Triage Vitals  Encounter Vitals Group     BP 02/15/24 1432 111/75     Systolic BP Percentile --      Diastolic BP Percentile --      Pulse Rate 02/15/24 1432 75     Resp 02/15/24 1432 18     Temp 02/15/24 1432 98.7 F (37.1 C)     Temp Source 02/15/24 1432 Oral     SpO2 02/15/24 1432 99 %     Weight --      Height --      Head Circumference --      Peak Flow --      Pain Score 02/15/24 1431 4     Pain Loc --      Pain Education --      Exclude from Growth Chart --    No data found.  Updated Vital Signs BP 111/75 (BP  Location: Right Arm)   Pulse 75   Temp 98.7 F (37.1 C) (Oral)   Resp 18   LMP 01/16/2024 (Exact Date)   SpO2 99%   Visual Acuity Right Eye Distance:   Left Eye Distance:   Bilateral Distance:    Right Eye Near:   Left Eye Near:    Bilateral Near:     Physical Exam Vitals reviewed.  Constitutional:      General: She is not in acute distress.    Appearance: She is not ill-appearing, toxic-appearing or diaphoretic.  HENT:     Mouth/Throat:     Mouth: Mucous membranes are moist.  Eyes:     Extraocular Movements: Extraocular movements intact.     Conjunctiva/sclera: Conjunctivae normal.     Pupils: Pupils are equal, round, and reactive to light.  Cardiovascular:     Rate and Rhythm: Normal rate and regular rhythm.  Pulmonary:     Effort: Pulmonary effort is normal.     Breath sounds: Normal breath sounds.  Abdominal:     Palpations: Abdomen is soft.     Tenderness: There is abdominal tenderness (Mild suprapubic tenderness.).  Musculoskeletal:     Cervical back: Neck supple.  Lymphadenopathy:     Cervical: No cervical adenopathy.  Skin:    Coloration: Skin is not jaundiced or pale.  Neurological:     General: No focal deficit present.     Mental Status: She is alert and oriented to person, place, and time.  Psychiatric:        Behavior: Behavior normal.      UC Treatments / Results  Labs (all labs ordered are listed, but only abnormal results are displayed) Labs Reviewed  HCG, SERUM, QUALITATIVE  RPR  HIV ANTIBODY (ROUTINE TESTING W REFLEX)  POCT URINE PREGNANCY  CERVICOVAGINAL ANCILLARY ONLY    EKG   Radiology No results found.  Procedures Procedures (including critical care time)  Medications Ordered in UC Medications - No data to display  Initial Impression / Assessment and Plan / UC Course  I have reviewed the triage vital signs and the nursing notes.  Pertinent labs & imaging results that were  available during my care of the patient were  reviewed by me and considered in my medical decision making (see chart for details).     UPT is negative.  Serum pregnancy test is done and we will call her if positive.  Vaginal self swab is done, and we will notify of any positives on that and treat per protocol.  Patient wanted to be tested also for HIV and syphilis, so those labs are ordered.  Will of course notify her if any of that is positive also Final Clinical Impressions(s) / UC Diagnoses   Final diagnoses:  Pelvic pain in female     Discharge Instructions       The urine pregnancy test was negative.  Staff will notify you if there is anything positive on the swab.  If the blood test for pregnancy is positive, we will call you also for that.  Ondansetron dissolved in the mouth every 8 hours as needed for nausea or vomiting. Clear liquids(water, gatorade/pedialyte, ginger ale/sprite, chicken broth/soup) and bland things(crackers/toast, rice, potato, bananas) to eat. Avoid acidic foods like lemon/lime/orange/tomato, and avoid greasy/spicy foods.       ED Prescriptions     Medication Sig Dispense Auth. Provider   ondansetron (ZOFRAN-ODT) 4 MG disintegrating tablet Take 1 tablet (4 mg total) by mouth every 8 (eight) hours as needed for nausea or vomiting. 10 tablet Marlinda Mike Janace Aris, MD      PDMP not reviewed this encounter.   Zenia Resides, MD 02/15/24 1524    Zenia Resides, MD 02/15/24 [phone]

## 2024-02-15 NOTE — ED Triage Notes (Addendum)
 Pt states she thinks she might have an ectopic pregnancy. She is having lower right sided pain where she has had a ectopic pregnancy in the past.   She states that she took the abortion pill in January she did have a cycle on 1/26-2/2. She states last week she spotted a little she thinks it was implantation. She is having a lot of nausea.   Pt states she would like blood work done to see if she is pregnant she took an at home test today which was Neg,

## 2024-02-15 NOTE — Discharge Instructions (Addendum)
 The urine pregnancy test was negative.  Staff will notify you if there is anything positive on the swab.  If the blood test for pregnancy is positive, we will call you also for that.  Ondansetron dissolved in the mouth every 8 hours as needed for nausea or vomiting. Clear liquids(water, gatorade/pedialyte, ginger ale/sprite, chicken broth/soup) and bland things(crackers/toast, rice, potato, bananas) to eat. Avoid acidic foods like lemon/lime/orange/tomato, and avoid greasy/spicy foods.

## 2024-02-16 LAB — CERVICOVAGINAL ANCILLARY ONLY
Bacterial Vaginitis (gardnerella): NEGATIVE
Candida Glabrata: NEGATIVE
Candida Vaginitis: NEGATIVE
Chlamydia: NEGATIVE
Comment: NEGATIVE
Comment: NEGATIVE
Comment: NEGATIVE
Comment: NEGATIVE
Comment: NEGATIVE
Comment: NORMAL
Neisseria Gonorrhea: NEGATIVE
Trichomonas: NEGATIVE

## 2024-02-16 LAB — RPR: RPR Ser Ql: NONREACTIVE

## 2024-05-26 ENCOUNTER — Ambulatory Visit (HOSPITAL_COMMUNITY)
Admission: EM | Admit: 2024-05-26 | Discharge: 2024-05-26 | Discharge status: Against Medical Advice | Attending: Family Medicine | Admitting: Family Medicine

## 2024-05-27 ENCOUNTER — Ambulatory Visit (HOSPITAL_COMMUNITY)

## 2024-06-20 DIAGNOSIS — J302 Other seasonal allergic rhinitis: Secondary | ICD-10-CM | POA: Diagnosis not present

## 2024-06-20 DIAGNOSIS — B3731 Acute candidiasis of vulva and vagina: Secondary | ICD-10-CM | POA: Diagnosis not present

## 2024-06-20 DIAGNOSIS — L819 Disorder of pigmentation, unspecified: Secondary | ICD-10-CM | POA: Diagnosis not present

## 2024-06-20 DIAGNOSIS — Z113 Encounter for screening for infections with a predominantly sexual mode of transmission: Secondary | ICD-10-CM | POA: Diagnosis not present

## 2024-09-06 ENCOUNTER — Ambulatory Visit: Admitting: Physician Assistant

## 2024-09-16 ENCOUNTER — Ambulatory Visit (HOSPITAL_COMMUNITY)
Admission: EM | Admit: 2024-09-16 | Discharge: 2024-09-16 | Disposition: A | Discharge status: Home / Self Care | Attending: Physician Assistant | Admitting: Physician Assistant

## 2024-09-16 ENCOUNTER — Encounter (HOSPITAL_COMMUNITY): Payer: Self-pay

## 2024-09-16 DIAGNOSIS — Z3202 Encounter for pregnancy test, result negative: Secondary | ICD-10-CM

## 2024-09-16 DIAGNOSIS — N898 Other specified noninflammatory disorders of vagina: Secondary | ICD-10-CM | POA: Insufficient documentation

## 2024-09-16 LAB — POCT URINE PREGNANCY: Preg Test, Ur: NEGATIVE

## 2024-09-16 MED ORDER — METRONIDAZOLE 500 MG PO TABS: 500.0000 mg | ORAL_TABLET | Freq: Two times a day (BID) | ORAL | 0 refills | Status: DC

## 2024-09-16 NOTE — ED Triage Notes (Addendum)
 Pt present vaginal odor and discharge, symptoms after having a abortion three weeks ago.

## 2024-09-16 NOTE — ED Provider Notes (Signed)
 [facility]    CSN: [id number] Arrival date & time: 09/16/24  1622      History   Chief Complaint Chief Complaint  Patient presents with   Vaginal Discharge    HPI Caitlin Grant is a 28 y.o. female.   Patient presents today with a 4 to 5-day history of vaginal odor.  She denies any significant discharge and denies any associated pelvic pain, abdominal pain, fever, nausea, vomiting.  She denies any recent changes to personal hygiene products including soaps or detergents.  Denies any recent antibiotics or medication changes.  She does report that she utilized a telehealth provider to obtain mifepristone and misoprostol and took this 09/03/2024.  She reports that she had a few days of cramping and bleeding and then this resolved.  She did not have any symptoms until a few days ago.  She does not have a OB/GYN currently and has not had any follow-up since abortion.      Past Medical History:  Diagnosis Date   Depression    Ectopic pregnancy     Patient Active Problem List   Diagnosis Date Noted   Indication for care in labor and delivery, antepartum 04/07/2022   Generalized anxiety disorder 05/10/2021   Moderate episode of recurrent major depressive disorder (HCC) 05/06/2021   Medication management 05/06/2021   SVD (spontaneous vaginal delivery) 01/31/2020   Obstetrical laceration 01/31/2020   Labor and delivery indication for care or intervention 01/29/2020   History of herpes genitalis 01/29/2020   Depression 01/29/2020   Premature rupture of membranes 01/28/2020    Past Surgical History:  Procedure Laterality Date   TONSILLECTOMY     WISDOM TOOTH EXTRACTION      OB History     Gravida  6   Para  2   Term  2   Preterm      AB  4   Living  2      SAB  4   IAB      Ectopic      Multiple  0   Live Births  2            Home Medications    Prior to Admission medications   Medication Sig Start Date End Date Taking? Authorizing  Provider  metroNIDAZOLE  (FLAGYL ) 500 MG tablet Take 1 tablet (500 mg total) by mouth 2 (two) times daily. 09/16/24  Yes Siria Calandro K, PA-C  acetaminophen  (TYLENOL ) 500 MG tablet Take 2 tablets (1,000 mg total) by mouth every 8 (eight) hours as needed (pain). 04/08/22   Clem Tawni HERO, MD  ondansetron  (ZOFRAN -ODT) 4 MG disintegrating tablet Take 1 tablet (4 mg total) by mouth every 8 (eight) hours as needed for nausea or vomiting. 02/15/24   Banister, Pamela K, MD  Prenatal Vit-Fe Fumarate-FA (PRENATAL MULTIVITAMIN) TABS tablet Take 1 tablet by mouth at bedtime.    [provider]  sertraline  (ZOLOFT ) 50 MG tablet Take 1 tablet (50 mg total) by mouth daily for 10 days. 06/20/23 06/30/23  Teresa Shelba SAUNDERS, NP  norethindrone  (ORTHO MICRONOR ) 0.35 MG tablet Take 1 tablet (0.35 mg total) by mouth daily. Patient not taking: Reported on 03/01/2020 01/31/20 04/27/20  Synthia Raisin, CNM    Family History Family History  Problem Relation Age of Onset   Healthy Mother    Healthy Father     Social History Social History   Tobacco Use   Smoking status: Former    Current packs/day: 0.50    Types:  Cigarettes   Smokeless tobacco: Never   Tobacco comments:    not while pregnant  Vaping Use   Vaping status: Former  Substance Use Topics   Alcohol use: Not Currently   Drug use: Not Currently    Types: Marijuana    Comment: March 2020     Allergies   Penicillins and Latex   Review of Systems Review of Systems  Constitutional:  Positive for activity change. Negative for appetite change, fatigue and fever.  Respiratory:  Negative for shortness of breath.   Cardiovascular:  Negative for chest pain.  Gastrointestinal:  Negative for abdominal pain, diarrhea, nausea and vomiting.  Genitourinary:  Positive for vaginal discharge. Negative for dysuria, frequency, pelvic pain, urgency, vaginal bleeding and vaginal pain.     Physical Exam Triage Vital Signs ED Triage Vitals  Encounter  Vitals Group     BP 09/16/24 1643 128/69     Girls Systolic BP Percentile --      Girls Diastolic BP Percentile --      Boys Systolic BP Percentile --      Boys Diastolic BP Percentile --      Pulse Rate 09/16/24 1643 (!) 111     Resp 09/16/24 1643 18     Temp 09/16/24 1643 98.3 F (36.8 C)     Temp Source 09/16/24 1643 Oral     SpO2 09/16/24 1643 100 %     Weight --      Height --      Head Circumference --      Peak Flow --      Pain Score 09/16/24 1644 0     Pain Loc --      Pain Education --      Exclude from Growth Chart --    No data found.  Updated Vital Signs BP 129/60 (BP Location: Left Arm)   Pulse 99   Temp 98.3 F (36.8 C) (Oral)   Resp 18   SpO2 97%   Visual Acuity Right Eye Distance:   Left Eye Distance:   Bilateral Distance:    Right Eye Near:   Left Eye Near:    Bilateral Near:     Physical Exam Vitals reviewed.  Constitutional:      General: She is awake. She is not in acute distress.    Appearance: Normal appearance. She is well-developed. She is not ill-appearing.     Comments: Very pleasant female appears stated age in no acute distress sitting comfortably in exam room  HENT:     Head: Normocephalic and atraumatic.  Cardiovascular:     Rate and Rhythm: Normal rate and regular rhythm.     Heart sounds: Normal heart sounds, S1 normal and S2 normal. No murmur heard. Pulmonary:     Effort: Pulmonary effort is normal.     Breath sounds: Normal breath sounds. No wheezing, rhonchi or rales.     Comments: Clear auscultation bilaterally Abdominal:     General: Bowel sounds are normal.     Palpations: Abdomen is soft.     Tenderness: There is no abdominal tenderness. There is no right CVA tenderness, left CVA tenderness, guarding or rebound.     Comments: Benign abdominal exam  Psychiatric:        Behavior: Behavior is cooperative.      UC Treatments / Results  Labs (all labs ordered are listed, but only abnormal results are  displayed) Labs Reviewed  POCT URINE PREGNANCY  CERVICOVAGINAL ANCILLARY ONLY  EKG   Radiology No results found.  Procedures Procedures (including critical care time)  Medications Ordered in UC Medications - No data to display  Initial Impression / Assessment and Plan / UC Course  I have reviewed the triage vital signs and the nursing notes.  Pertinent labs & imaging results that were available during my care of the patient were reviewed by me and considered in my medical decision making (see chart for details).     Patient is well-appearing, afebrile, nontoxic.  Patient was initially tachycardic but was agitated and talking very fast and reported that she had had some alcohol and believes this is contributing to her symptoms.  After I had her sit quietly her heart rate did normalize.  We did discuss that I cannot rule out retained products of conception in urgent care and so it is reasonable go to the emergency room, for, she had negative pregnancy test in clinic and denied any abnormal discharge or pelvic pain.  She could not go to the emergency room today because she has to take care of her children and requested that we go ahead and start treatment.  I did prescribe her metronidazole  and we discussed that she should follow-up ASAP with OB/GYN.  She was given the contact information for provider with instruction to call to schedule an appointment we discussed that if she has any significant discharge or additional symptoms such as fever, pelvic pain, nausea/vomiting she needs to go to the emergency room which she expressed understanding.  We discussed that she should not take the metronidazole  until she has not been drinking and that it is important to abstain from alcohol while on this medication as can cause vomiting.  STI swab was collected and is pending.  Will contact her if any change in additional treatment.  All questions were answered patient satisfaction and she expressed  understanding and agreement of treatment plan.  Final Clinical Impressions(s) / UC Diagnoses   Final diagnoses:  Vaginal odor     Discharge Instructions      We are treating you for bacterial vaginosis.  Take metronidazole  twice daily for 7 days.  Do not drink any alcohol while on this medication as it will cause you to vomit.  We will contact you if any of your other testing is abnormal and we need to arrange additional treatment.  Abstain from sex until you receive the results.  Use a condom for the sexual encounter.  Use hypoallergenic soaps and detergents and wear loosefitting cotton underwear.  If you have any worsening or changing symptoms including abnormal discharge, pelvic pain, abdominal pain, fever, nausea, vomiting you should be reevaluated.      ED Prescriptions     Medication Sig Dispense Auth. Provider   metroNIDAZOLE  (FLAGYL ) 500 MG tablet Take 1 tablet (500 mg total) by mouth 2 (two) times daily. 14 tablet Usha Slager K, PA-C      PDMP not reviewed this encounter.   Sherrell Rocky POUR, PA-C 09/16/24 1749

## 2024-09-16 NOTE — Discharge Instructions (Signed)
We are treating you for bacterial vaginosis.  Take metronidazole twice daily for 7 days.  Do not drink any alcohol while on this medication as it will cause you to vomit.  We will contact you if any of your other testing is abnormal and we need to arrange additional treatment.  Abstain from sex until you receive the results.  Use a condom for the sexual encounter.  Use hypoallergenic soaps and detergents and wear loosefitting cotton underwear.  If you have any worsening or changing symptoms including abnormal discharge, pelvic pain, abdominal pain, fever, nausea, vomiting you should be reevaluated.

## 2024-09-19 ENCOUNTER — Ambulatory Visit (HOSPITAL_COMMUNITY): Payer: Self-pay

## 2024-09-19 LAB — CERVICOVAGINAL ANCILLARY ONLY
Bacterial Vaginitis (gardnerella): POSITIVE — AB
Candida Glabrata: NEGATIVE
Candida Vaginitis: NEGATIVE
Chlamydia: NEGATIVE
Comment: NEGATIVE
Comment: NEGATIVE
Comment: NEGATIVE
Comment: NEGATIVE
Comment: NEGATIVE
Comment: NORMAL
Neisseria Gonorrhea: NEGATIVE
Trichomonas: NEGATIVE

## 2024-10-26 ENCOUNTER — Emergency Department (HOSPITAL_COMMUNITY)

## 2024-10-26 ENCOUNTER — Emergency Department (HOSPITAL_COMMUNITY)
Admission: EM | Admit: 2024-10-26 | Discharge: 2024-10-27 | Attending: Emergency Medicine | Admitting: Emergency Medicine

## 2024-10-26 DIAGNOSIS — R109 Unspecified abdominal pain: Secondary | ICD-10-CM | POA: Diagnosis present

## 2024-10-26 DIAGNOSIS — R000 Tachycardia, unspecified: Secondary | ICD-10-CM | POA: Diagnosis not present

## 2024-10-26 DIAGNOSIS — S0081XA Abrasion of other part of head, initial encounter: Secondary | ICD-10-CM | POA: Insufficient documentation

## 2024-10-26 DIAGNOSIS — D72829 Elevated white blood cell count, unspecified: Secondary | ICD-10-CM | POA: Insufficient documentation

## 2024-10-26 DIAGNOSIS — S0990XA Unspecified injury of head, initial encounter: Secondary | ICD-10-CM | POA: Diagnosis not present

## 2024-10-26 DIAGNOSIS — R456 Violent behavior: Secondary | ICD-10-CM | POA: Diagnosis not present

## 2024-10-26 DIAGNOSIS — R4689 Other symptoms and signs involving appearance and behavior: Secondary | ICD-10-CM | POA: Insufficient documentation

## 2024-10-26 LAB — CBC
HCT: 38.4 % (ref 36.0–46.0)
Hemoglobin: 13.8 g/dL (ref 12.0–15.0)
MCH: 27.8 pg (ref 26.0–34.0)
MCHC: 35.9 g/dL (ref 30.0–36.0)
MCV: 77.3 fL — ABNORMAL LOW (ref 80.0–100.0)
Platelets: 402 K/uL — ABNORMAL HIGH (ref 150–400)
RBC: 4.97 MIL/uL (ref 3.87–5.11)
RDW: 13.4 % (ref 11.5–15.5)
WBC: 11.5 K/uL — ABNORMAL HIGH (ref 4.0–10.5)
nRBC: 0 % (ref 0.0–0.2)

## 2024-10-26 LAB — URINALYSIS, ROUTINE W REFLEX MICROSCOPIC
Bilirubin Urine: NEGATIVE
Glucose, UA: NEGATIVE mg/dL
Ketones, ur: NEGATIVE mg/dL
Leukocytes,Ua: NEGATIVE
Nitrite: NEGATIVE
Protein, ur: NEGATIVE mg/dL
Specific Gravity, Urine: 1.003 — ABNORMAL LOW (ref 1.005–1.030)
pH: 6 (ref 5.0–8.0)

## 2024-10-26 LAB — COMPREHENSIVE METABOLIC PANEL WITH GFR
ALT: 14 U/L (ref 0–44)
AST: 21 U/L (ref 15–41)
Albumin: 4.1 g/dL (ref 3.5–5.0)
Alkaline Phosphatase: 44 U/L (ref 38–126)
Anion gap: 14 (ref 5–15)
BUN: 6 mg/dL (ref 6–20)
CO2: 17 mmol/L — ABNORMAL LOW (ref 22–32)
Calcium: 8.9 mg/dL (ref 8.9–10.3)
Chloride: 112 mmol/L — ABNORMAL HIGH (ref 98–111)
Creatinine, Ser: 0.67 mg/dL (ref 0.44–1.00)
GFR, Estimated: >60 mL/min (ref >60)
Glucose, Bld: 98 mg/dL (ref 70–99)
Potassium: 3 mmol/L — ABNORMAL LOW (ref 3.5–5.1)
Sodium: 143 mmol/L (ref 135–145)
Total Bilirubin: 1.1 mg/dL (ref 0.0–1.2)
Total Protein: 7.5 g/dL (ref 6.5–8.1)

## 2024-10-26 LAB — RAPID URINE DRUG SCREEN, HOSP PERFORMED
Amphetamines: NOT DETECTED
Barbiturates: NOT DETECTED
Benzodiazepines: NOT DETECTED
Cocaine: NOT DETECTED
Opiates: NOT DETECTED
Tetrahydrocannabinol: NOT DETECTED

## 2024-10-26 LAB — ACETAMINOPHEN LEVEL: Acetaminophen (Tylenol), Serum: <10 ug/mL — ABNORMAL LOW (ref 10–30)

## 2024-10-26 LAB — ETHANOL: Alcohol, Ethyl (B): 192 mg/dL — ABNORMAL HIGH (ref <15)

## 2024-10-26 LAB — SALICYLATE LEVEL: Salicylate Lvl: <7 mg/dL — ABNORMAL LOW (ref 7.0–30.0)

## 2024-10-26 LAB — HCG, SERUM, QUALITATIVE: Preg, Serum: NEGATIVE

## 2024-10-26 MED ORDER — LORAZEPAM 2 MG/ML IJ SOLN
INTRAMUSCULAR | Status: AC
  Administered 2024-10-26: 2 mg via INTRAMUSCULAR
  Filled 2024-10-26: qty 1

## 2024-10-26 MED ORDER — POTASSIUM CHLORIDE 20 MEQ PO PACK
80.0000 meq | PACK | Freq: Every day | ORAL | Status: DC
  Administered 2024-10-27: 80 meq via ORAL
  Filled 2024-10-26: qty 4

## 2024-10-26 MED ORDER — ZIPRASIDONE MESYLATE 20 MG IM SOLR
INTRAMUSCULAR | Status: AC
  Administered 2024-10-26: 20 mg via INTRAMUSCULAR
  Filled 2024-10-26: qty 20

## 2024-10-26 MED ORDER — ZIPRASIDONE MESYLATE 20 MG IM SOLR: 20.0000 mg | Freq: Once | INTRAMUSCULAR | Status: AC

## 2024-10-26 MED ORDER — LORAZEPAM 2 MG/ML IJ SOLN: 2.0000 mg | Freq: Once | INTRAMUSCULAR | Status: AC
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
## 2024-10-27 MED ORDER — POTASSIUM CHLORIDE CRYS ER 20 MEQ PO TBCR: 20.0000 meq | EXTENDED_RELEASE_TABLET | Freq: Two times a day (BID) | ORAL | 0 refills | Status: DC
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
## 2024-10-27 NOTE — Discharge Instructions (Signed)
 As we discussed, your workup here is reassuring.  Your potassium was slightly low so I am sending you home with potassium which you can take twice a day.  Please follow-up with your PCP.  If you do not have 1, I have referred you to 1.  Return to ED with new symptoms.

## 2024-11-07 ENCOUNTER — Telehealth: Admitting: Emergency Medicine

## 2024-11-07 DIAGNOSIS — R3989 Other symptoms and signs involving the genitourinary system: Secondary | ICD-10-CM | POA: Diagnosis not present

## 2024-11-07 MED ORDER — SULFAMETHOXAZOLE-TRIMETHOPRIM 800-160 MG PO TABS: 1.0000 | ORAL_TABLET | Freq: Two times a day (BID) | ORAL | 0 refills | Status: AC

## 2024-11-07 NOTE — Progress Notes (Signed)

## 2024-11-12 ENCOUNTER — Telehealth: Admitting: Nurse Practitioner

## 2024-11-12 DIAGNOSIS — B9689 Other specified bacterial agents as the cause of diseases classified elsewhere: Secondary | ICD-10-CM

## 2024-11-12 MED ORDER — METRONIDAZOLE 500 MG PO TABS: 500.0000 mg | ORAL_TABLET | Freq: Two times a day (BID) | ORAL | 0 refills | Status: AC

## 2024-11-12 NOTE — Progress Notes (Signed)
 We are sorry that you are not feeling well. Here is how we plan to help! Based on what you shared with me it looks like you: May have a vaginosis due to bacteria  Vaginosis is an inflammation of the vagina that can result in discharge, itching and pain. The cause is usually a change in the normal balance of vaginal bacteria or an infection. Vaginosis can also result from reduced estrogen levels after menopause.  The most common causes of vaginosis are:   Bacterial vaginosis which results from an overgrowth of one on several organisms that are normally present in your vagina.   Yeast infections which are caused by a naturally occurring fungus called candida.   Vaginal atrophy (atrophic vaginosis) which results from the thinning of the vagina from reduced estrogen levels after menopause.   Trichomoniasis which is caused by a parasite and is commonly transmitted by sexual intercourse.  Factors that increase your risk of developing vaginosis include: Medications, such as antibiotics and steroids Uncontrolled diabetes Use of hygiene products such as bubble bath, vaginal spray or vaginal deodorant Douching Wearing damp or tight-fitting clothing Using an intrauterine device (IUD) for birth control Hormonal changes, such as those associated with pregnancy, birth control pills or menopause Sexual activity Having a sexually transmitted infection  Your treatment plan is Metronidazole  or Flagyl  500mg  twice a day for 7 days.  I have electronically sent this prescription into the pharmacy that you have chosen.  Be sure to take all of the medication as directed. Stop taking any medication if you develop a rash, tongue swelling or shortness of breath. Mothers who are breast feeding should consider pumping and discarding their breast milk while on these antibiotics. However, there is no consensus that infant exposure at these doses would be harmful.  Remember that medication creams can weaken latex  condoms.   HOME CARE:  Good hygiene may prevent some types of vaginosis from recurring and may relieve some symptoms:  Avoid baths, hot tubs and whirlpool spas. Rinse soap from your outer genital area after a shower, and dry the area well to prevent irritation. Don't use scented or harsh soaps, such as those with deodorant or antibacterial action. Avoid irritants. These include scented tampons and pads. Wipe from front to back after using the toilet. Doing so avoids spreading fecal bacteria to your vagina.  Other things that may help prevent vaginosis include:  Don't douche. Your vagina doesn't require cleansing other than normal bathing. Repetitive douching disrupts the normal organisms that reside in the vagina and can actually increase your risk of vaginal infection. Douching won't clear up a vaginal infection. Use a latex condom. Both female and female latex condoms may help you avoid infections spread by sexual contact. Wear cotton underwear. Also wear pantyhose with a cotton crotch. If you feel comfortable without it, skip wearing underwear to bed. Yeast thrives in [employer] Your symptoms should improve in the next day or two.  GET HELP RIGHT AWAY IF:  You have pain in your lower abdomen ( pelvic area or over your ovaries) You develop nausea or vomiting You develop a fever Your discharge changes or worsens You have persistent pain with intercourse You develop shortness of breath, a rapid pulse, or you faint.  These symptoms could be signs of problems or infections that need to be evaluated by a medical provider now.  MAKE SURE YOU   Understand these instructions. Will watch your condition. Will get help right away if you are not doing  well or get worse.  Your e-visit answers were reviewed by a board certified advanced clinical practitioner to complete your personal care plan. Depending upon the condition, your plan could have included both over the counter or  prescription medications. Please review your pharmacy choice to make sure that you have choses a pharmacy that is open for you to pick up any needed prescription, Your safety is important to us . If you have drug allergies check your prescription carefully.   You can use MyChart to ask questions about today's visit, request a non-urgent call back, or ask for a work or school excuse for 24 hours related to this e-Visit. If it has been greater than 24 hours you will need to follow up with your provider, or enter a new e-Visit to address those concerns. You will get a MyChart message within the next two days asking about your experience. I hope that your e-visit has been valuable and will speed your recovery.  I have spent 5 minutes in review of e-visit questionnaire, review and updating patient chart, medical decision making and response to patient.   Cniyah Sproull W Caila Cirelli, NP

## 2024-12-01 ENCOUNTER — Encounter: Payer: Self-pay | Admitting: Certified Nurse Midwife

## 2024-12-01 ENCOUNTER — Ambulatory Visit (HOSPITAL_COMMUNITY)
Admission: EM | Admit: 2024-12-01 | Discharge: 2024-12-01 | Disposition: A | Discharge status: Home / Self Care | Attending: Nurse Practitioner | Admitting: Nurse Practitioner

## 2024-12-01 ENCOUNTER — Encounter (HOSPITAL_COMMUNITY): Payer: Self-pay

## 2024-12-01 DIAGNOSIS — R8290 Unspecified abnormal findings in urine: Secondary | ICD-10-CM | POA: Insufficient documentation

## 2024-12-01 DIAGNOSIS — Z3202 Encounter for pregnancy test, result negative: Secondary | ICD-10-CM | POA: Diagnosis not present

## 2024-12-01 DIAGNOSIS — N898 Other specified noninflammatory disorders of vagina: Secondary | ICD-10-CM | POA: Diagnosis not present

## 2024-12-01 DIAGNOSIS — Z113 Encounter for screening for infections with a predominantly sexual mode of transmission: Secondary | ICD-10-CM | POA: Diagnosis not present

## 2024-12-01 LAB — POCT URINE DIPSTICK
Bilirubin, UA: NEGATIVE
Glucose, UA: NEGATIVE mg/dL
Nitrite, UA: NEGATIVE
POC PROTEIN,UA: NEGATIVE
Spec Grav, UA: 1.02 (ref 1.010–1.025)
Urobilinogen, UA: 0.2 U/dL
pH, UA: 7 (ref 5.0–8.0)

## 2024-12-01 LAB — POCT URINE PREGNANCY: Preg Test, Ur: NEGATIVE

## 2024-12-01 NOTE — ED Provider Notes (Addendum)
 [facility]    CSN: [id number] Arrival date & time: 12/01/24  9062      History   Chief Complaint Chief Complaint  Patient presents with   Vaginal Discharge    HPI Caitlin Grant is a 28 y.o. female.   Patient presents today with 3-day history of cloudy, very yellow urine.  She denies dysuria, urinary frequency urgency, voiding small amounts, abdominal pain, consistent pelvic pain, nausea/vomiting, fever, and flank pain.  She also endorses 3-day history of clear and sometimes white vaginal discharge.  Denies vaginal itching, vaginal odor, rashes, sores, lesions, or vaginal swelling.  No known exposures to STI.  Reports she has a new partner and he wanted her to be tested.    Past Medical History:  Diagnosis Date   Depression    Ectopic pregnancy     Patient Active Problem List   Diagnosis Date Noted   Indication for care in labor and delivery, antepartum 04/07/2022   Generalized anxiety disorder 05/10/2021   Moderate episode of recurrent major depressive disorder (HCC) 05/06/2021   Medication management 05/06/2021   SVD (spontaneous vaginal delivery) 01/31/2020   Obstetrical laceration 01/31/2020   Labor and delivery indication for care or intervention 01/29/2020   History of herpes genitalis 01/29/2020   Depression 01/29/2020   Premature rupture of membranes 01/28/2020    Past Surgical History:  Procedure Laterality Date   TONSILLECTOMY     WISDOM TOOTH EXTRACTION      OB History     Gravida  6   Para  2   Term  2   Preterm      AB  4   Living  2      SAB  4   IAB      Ectopic      Multiple  0   Live Births  2            Home Medications    Prior to Admission medications  Medication Sig Start Date End Date Taking? Authorizing Provider  magnesium gluconate (MAGONATE) 500 (27 Mg) MG TABS tablet Take 1,000 mg by mouth daily.   Yes [provider]  OVER THE COUNTER MEDICATION Take 2 capsules by mouth in the  morning. URO Moto Metabolism with Green Tea.    [provider]  potassium chloride  SA (KLOR-CON  M) 20 MEQ tablet Take 1 tablet (20 mEq total) by mouth 2 (two) times daily. 10/27/24   Ruthell Lonni FALCON, PA-C  Probiotic Product (PROBIOTIC ADVANCED FORMULA) CAPS Take 2 capsules by mouth in the morning. Physician's Choice Digestive Enzymes    [provider]  Rhubarb-Ashwagandha-Magnesium 4-125-50 MG TABS Take 2 tablets by mouth in the morning. Patient not taking: Reported on 12/01/2024    [provider]  norethindrone  (ORTHO MICRONOR ) 0.35 MG tablet Take 1 tablet (0.35 mg total) by mouth daily. Patient not taking: Reported on 03/01/2020 01/31/20 04/27/20  Synthia Raisin, CNM    Family History Family History  Problem Relation Age of Onset   Healthy Mother    Healthy Father     Social History Social History[1]   Allergies   Penicillins and Latex   Review of Systems Review of Systems Per HPI  Physical Exam Triage Vital Signs ED Triage Vitals  Encounter Vitals Group     BP 12/01/24 0953 117/76     Girls Systolic BP Percentile --      Girls Diastolic BP Percentile --      Boys Systolic BP Percentile --  Boys Diastolic BP Percentile --      Pulse Rate 12/01/24 0953 84     Resp 12/01/24 0953 16     Temp 12/01/24 0953 99.2 F (37.3 C)     Temp Source 12/01/24 0953 Oral     SpO2 12/01/24 0953 94 %     Weight --      Height --      Head Circumference --      Peak Flow --      Pain Score 12/01/24 0956 0     Pain Loc --      Pain Education --      Exclude from Growth Chart --    No data found.  Updated Vital Signs BP 117/76 (BP Location: Left Arm)   Pulse 84   Temp 99.2 F (37.3 C) (Oral)   Resp 16   LMP 11/23/2024   SpO2 94%   Visual Acuity Right Eye Distance:   Left Eye Distance:   Bilateral Distance:    Right Eye Near:   Left Eye Near:    Bilateral Near:     Physical Exam Vitals and nursing note reviewed.  Constitutional:       General: She is not in acute distress.    Appearance: Normal appearance. She is not toxic-appearing.  Pulmonary:     Effort: Pulmonary effort is normal. No respiratory distress.  Abdominal:     General: Abdomen is flat. Bowel sounds are normal. There is no distension.     Palpations: Abdomen is soft. There is no mass.     Tenderness: There is no abdominal tenderness. There is no right CVA tenderness, left CVA tenderness or guarding.  Genitourinary:    Comments: Deferred - self swab performed by patient Skin:    General: Skin is warm and dry.     Coloration: Skin is not jaundiced or pale.     Findings: No erythema.  Neurological:     Mental Status: She is alert and oriented to person, place, and time.     Motor: No weakness.     Gait: Gait normal.  Psychiatric:        Behavior: Behavior is cooperative.      UC Treatments / Results  Labs (all labs ordered are listed, but only abnormal results are displayed) Labs Reviewed  POCT URINE DIPSTICK - Abnormal; Notable for the following components:      Result Value   Color, UA orange (*)    Clarity, UA cloudy (*)    Ketones, POC UA trace (5) (*)    Blood, UA trace-lysed (*)    Leukocytes, UA Small (1+) (*)    All other components within normal limits  URINE CULTURE  HIV ANTIBODY (ROUTINE TESTING W REFLEX)  POCT URINE PREGNANCY  CERVICOVAGINAL ANCILLARY ONLY    EKG   Radiology No results found.  Procedures Procedures (including critical care time)  Medications Ordered in UC Medications - No data to display  Initial Impression / Assessment and Plan / UC Course  I have reviewed the triage vital signs and the nursing notes.  Pertinent labs & imaging results that were available during my care of the patient were reviewed by me and considered in my medical decision making (see chart for details).   Patient is a pleasant, well-appearing 28 year old African-American female presenting today for cloudy urine and  requesting STI testing.  Vital signs are stable and examination is reassuring today.  Urinalysis is cloudy with small leukocyte esterase, unclear  if due to vaginal discharge.  Will defer treatment for UTI until urine culture results.  She is asymptomatic at this time.  Vaginal swab is pending-treat as indicated if anything positive.  HIV and syphilis testing also pending per patient's request.  Return and ER precautions discussed.  Safe sex practices discussed.  Update: Multiple attempts were made to draw blood for HIV/RPR and were unsuccessful.  Recommended hydration and return for reattempt tomorrow.  The patient was given the opportunity to ask questions.  All questions answered to their satisfaction.  The patient is in agreement to this plan.   Final Clinical Impressions(s) / UC Diagnoses   Final diagnoses:  Vaginal discharge  Cloudy urine  Urine pregnancy test negative     Discharge Instructions      We will contact you if the testing from today comes back abnormal.  Recommend condom use with every sexual encounter to prevent STI.    ED Prescriptions   None    PDMP not reviewed this encounter.    Chandra Harlene LABOR, NP 12/01/24 1102     [1]  Social History Tobacco Use   Smoking status: Former    Current packs/day: 0.50    Types: Cigarettes   Smokeless tobacco: Never   Tobacco comments:    not while pregnant  Vaping Use   Vaping status: Former  Substance Use Topics   Alcohol use: Yes   Drug use: Not Currently    Types: Marijuana    Comment: March 2020     Chandra Harlene LABOR, NP 12/01/24 1114

## 2024-12-01 NOTE — ED Notes (Addendum)
 This RN stuck patient twice for blood work. Blood flowing very slowly and clotting before could get enough specimen for labs ordered.  Holli RN making provider aware.

## 2024-12-01 NOTE — Discharge Instructions (Signed)
 We will contact you if the testing from today comes back abnormal.  Recommend condom use with every sexual encounter to prevent STI.

## 2024-12-01 NOTE — ED Triage Notes (Signed)
 Patient reports that she is requesting STD testing and HIV testing as well. Patient states she has a new sexual partner. Patient c/o a clear vaginal discharge x 3 days and cloudy urine x 3 days. Patient states her urine is a funny yellow since taking magnesium.

## 2024-12-01 NOTE — ED Notes (Signed)
 This RN stuck this patient x 1 for blood work ordered and the blood clotted prior to getting it to the lab. The blood was slow flowing into the collection tube.

## 2024-12-02 ENCOUNTER — Ambulatory Visit (HOSPITAL_COMMUNITY): Payer: Self-pay

## 2024-12-02 LAB — CERVICOVAGINAL ANCILLARY ONLY
Bacterial Vaginitis (gardnerella): NEGATIVE
Candida Glabrata: NEGATIVE
Candida Vaginitis: NEGATIVE
Chlamydia: NEGATIVE
Comment: NEGATIVE
Comment: NEGATIVE
Comment: NEGATIVE
Comment: NEGATIVE
Comment: NEGATIVE
Comment: NORMAL
Neisseria Gonorrhea: NEGATIVE
Trichomonas: NEGATIVE

## 2024-12-02 LAB — URINE CULTURE: Culture: NO GROWTH

## 2024-12-07 ENCOUNTER — Telehealth: Admitting: Physician Assistant

## 2024-12-07 DIAGNOSIS — R3989 Other symptoms and signs involving the genitourinary system: Secondary | ICD-10-CM | POA: Diagnosis not present

## 2024-12-07 MED ORDER — NITROFURANTOIN MONOHYD MACRO 100 MG PO CAPS: 100.0000 mg | ORAL_CAPSULE | Freq: Two times a day (BID) | ORAL | 0 refills | Status: AC

## 2024-12-07 NOTE — Progress Notes (Signed)
